# Patient Record
Sex: Male | Born: 2000 | Hispanic: No | Marital: Single | Smoking: Never smoker
Health system: Southern US, Community
[De-identification: ages and names within clinical notes are randomized; demographics above are authoritative.]

## PROBLEM LIST (undated history)

## (undated) DIAGNOSIS — G510 Bell's palsy: Secondary | ICD-10-CM

## (undated) HISTORY — PX: WISDOM TOOTH EXTRACTION: SHX21

## (undated) HISTORY — DX: Bell's palsy: G51.0

---
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
## 2022-05-30 ENCOUNTER — Emergency Department (HOSPITAL_BASED_OUTPATIENT_CLINIC_OR_DEPARTMENT_OTHER): Payer: Medicaid Other | Admitting: Radiology

## 2022-05-30 ENCOUNTER — Emergency Department (HOSPITAL_BASED_OUTPATIENT_CLINIC_OR_DEPARTMENT_OTHER): Payer: Medicaid Other

## 2022-05-30 ENCOUNTER — Encounter (HOSPITAL_BASED_OUTPATIENT_CLINIC_OR_DEPARTMENT_OTHER): Payer: Self-pay | Admitting: Emergency Medicine

## 2022-05-30 ENCOUNTER — Emergency Department (HOSPITAL_BASED_OUTPATIENT_CLINIC_OR_DEPARTMENT_OTHER)
Admission: EM | Admit: 2022-05-30 | Discharge: 2022-05-30 | Disposition: A | Discharge status: Home / Self Care | Payer: Medicaid Other | Attending: Emergency Medicine | Admitting: Emergency Medicine

## 2022-05-30 DIAGNOSIS — R112 Nausea with vomiting, unspecified: Secondary | ICD-10-CM | POA: Diagnosis not present

## 2022-05-30 DIAGNOSIS — R200 Anesthesia of skin: Secondary | ICD-10-CM | POA: Diagnosis present

## 2022-05-30 DIAGNOSIS — G510 Bell's palsy: Secondary | ICD-10-CM | POA: Insufficient documentation

## 2022-05-30 LAB — URINALYSIS, ROUTINE W REFLEX MICROSCOPIC
Bilirubin Urine: NEGATIVE
Cellular Cast, UA: 4
Glucose, UA: NEGATIVE mg/dL
Ketones, ur: 15 mg/dL — AB
Leukocytes,Ua: NEGATIVE
Nitrite: NEGATIVE
Specific Gravity, Urine: 1.041 — ABNORMAL HIGH (ref 1.005–1.030)
Trans Epithel, UA: 2
pH: 7 (ref 5.0–8.0)

## 2022-05-30 LAB — COMPREHENSIVE METABOLIC PANEL
ALT: 26 U/L (ref 0–44)
AST: 20 U/L (ref 15–41)
Albumin: 5.5 g/dL — ABNORMAL HIGH (ref 3.5–5.0)
Alkaline Phosphatase: 47 U/L (ref 38–126)
Anion gap: 19 — ABNORMAL HIGH (ref 5–15)
BUN: 25 mg/dL — ABNORMAL HIGH (ref 6–20)
CO2: 23 mmol/L (ref 22–32)
Calcium: 11.1 mg/dL — ABNORMAL HIGH (ref 8.9–10.3)
Chloride: 98 mmol/L (ref 98–111)
Creatinine, Ser: 1.45 mg/dL — ABNORMAL HIGH (ref 0.61–1.24)
GFR, Estimated: >60 mL/min (ref >60)
Glucose, Bld: 108 mg/dL — ABNORMAL HIGH (ref 70–99)
Potassium: 3.8 mmol/L (ref 3.5–5.1)
Sodium: 140 mmol/L (ref 135–145)
Total Bilirubin: 2 mg/dL — ABNORMAL HIGH (ref 0.3–1.2)
Total Protein: 8.7 g/dL — ABNORMAL HIGH (ref 6.5–8.1)

## 2022-05-30 LAB — CBC WITH DIFFERENTIAL/PLATELET
Abs Immature Granulocytes: 0.07 10*3/uL (ref 0.00–0.07)
Basophils Absolute: 0 10*3/uL (ref 0.0–0.1)
Basophils Relative: 0 %
Eosinophils Absolute: 0 10*3/uL (ref 0.0–0.5)
Eosinophils Relative: 0 %
HCT: 47.1 % (ref 39.0–52.0)
Hemoglobin: 16 g/dL (ref 13.0–17.0)
Immature Granulocytes: 1 %
Lymphocytes Relative: 13 %
Lymphs Abs: 1.9 10*3/uL (ref 0.7–4.0)
MCH: 28.7 pg (ref 26.0–34.0)
MCHC: 34 g/dL (ref 30.0–36.0)
MCV: 84.4 fL (ref 80.0–100.0)
Monocytes Absolute: 1.6 10*3/uL — ABNORMAL HIGH (ref 0.1–1.0)
Monocytes Relative: 11 %
Neutro Abs: 11 10*3/uL — ABNORMAL HIGH (ref 1.7–7.7)
Neutrophils Relative %: 75 %
Platelets: 215 10*3/uL (ref 150–400)
RBC: 5.58 MIL/uL (ref 4.22–5.81)
RDW: 12.3 % (ref 11.5–15.5)
WBC: 14.6 10*3/uL — ABNORMAL HIGH (ref 4.0–10.5)
nRBC: 0 % (ref 0.0–0.2)

## 2022-05-30 LAB — PROTIME-INR
INR: 1.1 (ref 0.8–1.2)
Prothrombin Time: 14.2 seconds (ref 11.4–15.2)

## 2022-05-30 LAB — LACTIC ACID, PLASMA
Lactic Acid, Venous: 3.5 mmol/L (ref 0.5–1.9)
Lactic Acid, Venous: 1.7 mmol/L (ref 0.5–1.9)

## 2022-05-30 MED ORDER — ONDANSETRON HCL 4 MG/2ML IJ SOLN
4.0000 mg | Freq: Once | INTRAMUSCULAR | Status: AC
  Administered 2022-05-30: 4 mg via INTRAVENOUS
  Filled 2022-05-30: qty 2

## 2022-05-30 MED ORDER — SODIUM CHLORIDE 0.9 % IV BOLUS
1000.0000 mL | Freq: Once | INTRAVENOUS | Status: AC
  Administered 2022-05-30: 1000 mL via INTRAVENOUS

## 2022-05-30 MED ORDER — IOHEXOL 300 MG/ML  SOLN
75.0000 mL | Freq: Once | INTRAMUSCULAR | Status: AC | PRN
  Administered 2022-05-30: 75 mL via INTRAVENOUS

## 2022-05-30 MED ORDER — OXYCODONE-ACETAMINOPHEN 5-325 MG PO TABS: 1.0000 | ORAL_TABLET | Freq: Three times a day (TID) | ORAL | 0 refills | Status: AC | PRN

## 2022-05-30 MED ORDER — MORPHINE SULFATE (PF) 4 MG/ML IV SOLN
4.0000 mg | Freq: Once | INTRAVENOUS | Status: AC
  Administered 2022-05-30: 4 mg via INTRAVENOUS
  Filled 2022-05-30: qty 1

## 2022-05-30 MED ORDER — PREDNISONE 10 MG PO TABS: 60.0000 mg | ORAL_TABLET | Freq: Every day | ORAL | 0 refills | Status: AC

## 2022-05-30 MED ORDER — POLYVINYL ALCOHOL 1.4 % OP SOLN: 1.0000 [drp] | OPHTHALMIC | 1 refills | Status: AC

## 2022-05-30 MED ORDER — ONDANSETRON 4 MG PO TBDP: 4.0000 mg | ORAL_TABLET | Freq: Three times a day (TID) | ORAL | 0 refills | Status: DC | PRN

## 2022-05-30 NOTE — ED Triage Notes (Signed)
Pt had all wisdom teeth extracted yesterday. Left bottom was infected prior, was treated with amoxicillin, started Monday, extraction Wednesday, still on antibiotic. Left side of face "not moving" ,was like this last week.

## 2022-05-30 NOTE — ED Notes (Signed)
Patient transported to CT 

## 2022-05-30 NOTE — ED Provider Notes (Signed)
[facility] EMERGENCY DEPT Provider Note   CSN: [id number] Arrival date & time: 05/30/22  1823     History  Chief Complaint  Patient presents with   Emesis    Caleb Mendoza is a 21 y.o. male.  Patient with no pertinent past medical history presents today with complaints of left sided facial numbness, nausea, and vomiting. He states that approximately 2 weeks ago he began to develop pain in his left lower molars. He states that around 10 days ago he noticed some numbness in his left lower cheek area. He states that over the past several days this progressively worsened and now the entire left side of his face is numb. He states that given his persistent left lower tooth pain, he presented to his dentist for further evaluation and management of same. He states that his dentist suspected that his symptoms were related to an infected left lower wisdom tooth. He states that he was then placed on Amoxicillin for management of his infection which he started on Monday. He states he has been taking this as prescribed without complication. He then had all 4 of his wisdom teeth removed yesterday without complication. He has continued to take the Amoxicillin without any missed doses. States that today he began to have significant nausea and vomiting throughout the day which he attributed to swallowing blood from his dental procedure. He feels that he is severely dehydrated due to this. Denies any diarrhea, states that he has been having normal bowel movements and passing flatus regularly. He states that he continues to have facial numbness and paralysis of the entire left side of his face up to his forehead as well. He is unable to close his left eyelid. He denies fevers, chills, chest pain, shortness of breath, or abdominal pain. Denies any dysuria or hematuria. States that he is supposed to follow-up with his oral surgeon in the next few weeks.  The history is provided by the patient. No  language interpreter was used.  Emesis Associated symptoms: no headaches        Home Medications Prior to Admission medications   Medication Sig Start Date End Date Taking? Authorizing Provider  cephALEXin (KEFLEX) 500 MG capsule Take 1 capsule (500 mg total) by mouth 4 (four) times daily. 09/08/12   Molpus, John, MD  HYDROcodone-acetaminophen (NORCO) 5-325 MG tablet Take 1-2 tablets by mouth every 4 (four) hours as needed for severe pain. 09/29/15   Mack Hook, MD      Allergies    Patient has no known allergies.    Review of Systems   Review of Systems  Gastrointestinal:  Positive for nausea and vomiting.  Neurological:  Positive for facial asymmetry and numbness. Negative for dizziness, tremors, seizures, syncope, speech difficulty, weakness, light-headedness and headaches.  All other systems reviewed and are negative.   Physical Exam Updated Vital Signs BP 124/67   Pulse 71   Temp 98 F (36.7 C) (Oral)   Resp 13   SpO2 100%  Physical Exam Vitals and nursing note reviewed.  Constitutional:      General: He is not in acute distress.    Appearance: Normal appearance. He is normal weight. He is not ill-appearing, toxic-appearing or diaphoretic.  HENT:     Head: Normocephalic and atraumatic.     Comments: No obvious swelling or abscess present.    Right Ear: Tympanic membrane, ear canal and external ear normal.     Left Ear: Tympanic membrane, ear canal and external ear  normal.     Ears:     Comments: Inner ears without signs of rashes, lesions, or other abnormality bilaterally    Mouth/Throat:     Comments: Postsurgical open wounds in the back of bilateral upper and lower back molars normal appearing without obvious drainage or bleeding. Blood clots visualized in each location without signs of dry socket. No intraoral swelling or signs of Ludwig's angina Eyes:     Extraocular Movements: Extraocular movements intact.     Pupils: Pupils are equal, round, and reactive  to light.  Cardiovascular:     Rate and Rhythm: Normal rate and regular rhythm.     Heart sounds: Normal heart sounds.  Pulmonary:     Effort: Pulmonary effort is normal. No respiratory distress.     Breath sounds: Normal breath sounds.  Abdominal:     General: Abdomen is flat.     Palpations: Abdomen is soft.  Musculoskeletal:        General: Normal range of motion.     Cervical back: Normal range of motion and neck supple.  Skin:    General: Skin is warm and dry.  Neurological:     General: No focal deficit present.     Mental Status: He is alert and oriented to person, place, and time.     GCS: GCS eye subscore is 4. GCS verbal subscore is 5. GCS motor subscore is 6.     Sensory: Sensation is intact.     Motor: Motor function is intact.     Coordination: Coordination is intact.     Gait: Gait is intact.     Comments: Alert and oriented to self, place, time and event.    Speech is fluent, clear without dysarthria or dysphasia.    Strength 5/5 in upper/lower extremities   Sensation intact in upper/lower extremities   Patient endorses complete left sided facial numbness from his forehead to his mandible. He is unable to raise the corner of the left side of his mouth. Flattening of the nasolabial fold on the left side. Unable to raise the left eyebrow. Loss of wrinkles on the left side of the face.   PERRLA and EOMs intact bilaterally. Intact visual fields. No uvula deviation, symmetric rise of soft palate. 5/5 SCM and trapezius strength bilaterally. Midline tongue protrusion, symmetric L/R movements   Psychiatric:        Mood and Affect: Mood normal.        Behavior: Behavior normal.     ED Results / Procedures / Treatments   Labs (all labs ordered are listed, but only abnormal results are displayed) Labs Reviewed  COMPREHENSIVE METABOLIC PANEL - Abnormal; Notable for the following components:      Result Value   Glucose, Bld 108 (*)    BUN 25 (*)    Creatinine, Ser  1.45 (*)    Calcium 11.1 (*)    Total Protein 8.7 (*)    Albumin 5.5 (*)    Total Bilirubin 2.0 (*)    Anion gap 19 (*)    All other components within normal limits  LACTIC ACID, PLASMA - Abnormal; Notable for the following components:   Lactic Acid, Venous 3.5 (*)    All other components within normal limits  CBC WITH DIFFERENTIAL/PLATELET - Abnormal; Notable for the following components:   WBC 14.6 (*)    Neutro Abs 11.0 (*)    Monocytes Absolute 1.6 (*)    All other components within normal limits  URINALYSIS, ROUTINE  W REFLEX MICROSCOPIC - Abnormal; Notable for the following components:   Specific Gravity, Urine 1.041 (*)    Hgb urine dipstick TRACE (*)    Ketones, ur 15 (*)    Protein, ur TRACE (*)    Bacteria, UA RARE (*)    All other components within normal limits  CULTURE, BLOOD (ROUTINE X 2)  LACTIC ACID, PLASMA  PROTIME-INR    EKG None  Radiology CT Maxillofacial W Contrast  Result Date: 05/30/2022 CLINICAL DATA:  Facial swelling and pain. Recent wisdom tooth extraction. EXAM: CT MAXILLOFACIAL WITH CONTRAST TECHNIQUE: Multidetector CT imaging of the maxillofacial structures was performed with intravenous contrast. Multiplanar CT image reconstructions were also generated. RADIATION DOSE REDUCTION: This exam was performed according to the departmental dose-optimization program which includes automated exposure control, adjustment of the mA and/or kV according to patient size and/or use of iterative reconstruction technique. CONTRAST:  75mL OMNIPAQUE IOHEXOL 300 MG/ML  SOLN COMPARISON:  None Available. FINDINGS: Osseous: Postsurgical changes of recent third molar extractions. Orbits: Normal Sinuses: Small amount of fluid in the right maxillary sinus and small retention cyst in the left. Soft tissues: No abscess or drainable fluid collection. No discrete abnormality. Limited intracranial: Normal. IMPRESSION: Postsurgical changes of recent third molar extractions. No abscess or  drainable fluid collection. Electronically Signed   By: Deatra Robinson M.D.   On: 05/30/2022 20:54   DG Chest 2 View  Result Date: 05/30/2022 CLINICAL DATA:  Suspected sepsis. EXAM: CHEST - 2 VIEW COMPARISON:  None Available. FINDINGS: The heart size and mediastinal contours are within normal limits. Mild hyperinflation of the lungs is noted. Both lungs are clear. No acute osseous abnormality. IMPRESSION: No active cardiopulmonary disease. Electronically Signed   By: Thornell Sartorius M.D.   On: 05/30/2022 20:41    Procedures Procedures    Medications Ordered in ED Medications  ondansetron (ZOFRAN) injection 4 mg (4 mg Intravenous Given 05/30/22 1919)  sodium chloride 0.9 % bolus 1,000 mL (0 mLs Intravenous Stopped 05/30/22 2136)  iohexol (OMNIPAQUE) 300 MG/ML solution 75 mL (75 mLs Intravenous Contrast Given 05/30/22 2032)  sodium chloride 0.9 % bolus 1,000 mL (1,000 mLs Intravenous New Bag/Given 05/30/22 2134)  morphine (PF) 4 MG/ML injection 4 mg (4 mg Intravenous Given 05/30/22 2233)  ondansetron (ZOFRAN) injection 4 mg (4 mg Intravenous Given 05/30/22 2233)    ED Course/ Medical Decision Making/ A&P                           Medical Decision Making Amount and/or Complexity of Data Reviewed Labs: ordered. Radiology: ordered.  Risk Prescription drug management.   This patient presents to the ED for concern of left sided facial numbness, nausea, and vomiting, this involves an extensive number of treatment options, and is a complaint that carries with it a high risk of complications and morbidity.   Co morbidities that complicate the patient evaluation  none   Lab Tests:  I Ordered, and personally interpreted labs.  The pertinent results include:  BUN 25, creatinine 1.45, calcium 11.1, total protein 8.7, albumin 5.5, t. Bili 2.0, anion gap 19. Lactic acid 3.5 --> 1.7. WBC 14.6. UA with ketonuria and proteinuria   Imaging Studies ordered:  I ordered imaging studies including CXR, CT  max/face  I independently visualized and interpreted imaging which showed  CXR: no active cardiopulmonary disease CT: Postsurgical changes of recent third molar extractions. No abscess or drainable fluid collection. I agree with the radiologist  interpretation   Problem List / ED Course / Critical interventions / Medication management  I ordered medication including zofran for nausea and vomiting, fluids for dehydration and percocet  for pain from tooth extraction  Reevaluation of the patient after these medicines showed that the patient improved I have reviewed the patients home medicines and have made adjustments as needed   Test / Admission - Considered:  Patient presents today with complaints of left sided facial numbness, nausea, and vomiting. He is afebrile, non-toxic appearing, and in no acute distress with reassuring vital signs. Laboratory abnormalities consistent with dehydration and reactive post-surgical changes. CT without evidence of abscess. Patient is alert and oriented with neurological changes consistent with Bell's palsy likely due to inflammatory facial changes from his tooth infection. No concern for CVA given patient is young and otherwise healthy with no deficits inconsistent with Bell's palsy. Patient has now been symptomatic for 10 days, however will treat with high dose prednisone and close neurology follow-up. Will also give liquid tears as he is having difficulty closing his left eye.  Additionally, patient with nausea and vomiting x 1 day. Patient was very dehydration on laboratory evaluation and was repleted with fluid boluses x2. After anti-emetics and fluids, patient states he is feeling much better. He is no longer nauseous or vomiting and is able to tolerate po intake without difficulty. His abdomen is soft and non-tender. He is having regular bowel movements. No concern for bowel obstruction or other acute intra-abdominal abnormalities at this time. Patient is  stable for discharge at this time. Will give prescription for Zofran for nausea. Will also give percocet for post-surgical pain as well. I have reviewed PDMP and deemed patient an adequate candidate for a short course of narcotic pain medication. Patient is understanding and amenable with plan, educated on red flag symptoms that would prompt immediate return. Discharged in stable condition.    This is a shared visit with supervising physician Dr. Deretha EmoryZackowski who has independently evaluated patient & provided guidance in evaluation/management/disposition, in agreement with care    Final Clinical Impression(s) / ED Diagnoses Final diagnoses:  Bell's palsy  Nausea and vomiting, unspecified vomiting type    Rx / DC Orders ED Discharge Orders          Ordered    predniSONE (DELTASONE) 10 MG tablet  Daily        05/30/22 2323    ondansetron (ZOFRAN-ODT) 4 MG disintegrating tablet  Every 8 hours PRN        05/30/22 2323    oxyCODONE-acetaminophen (PERCOCET/ROXICET) 5-325 MG tablet  Every 8 hours PRN        05/30/22 2323    polyvinyl alcohol (LIQUIFILM TEARS) 1.4 % ophthalmic solution  Every hour while awake        05/30/22 2323          An After Visit Summary was printed and given to the patient.     Vear ClockSmoot, Aarin Sparkman A, PA-C 06/03/22 1601    Vanetta MuldersZackowski, Scott, MD 07/01/22 [phone]

## 2022-05-30 NOTE — Discharge Instructions (Addendum)
As we discussed, the numbness in your face is consistent with a condition called Bell's palsy.  I suspect that this condition occurred due to your tooth infection.  You will need to take prednisone as prescribed for management of the symptoms.  I have also given you a referral to neurology for further evaluation and management of your condition.  In the interim, while you are symptomatic you will need to use the Liquifilm Tears in your left eye to help lubricate your eye since you are unable to close it all the way.  Additionally, I have given you a prescription for narcotic pain medication to help with your dental pain from your procedure.  Please take this as prescribed for severe pain only and do not drive or operate heavy machinery after taking this medication.   Also, for your nausea and vomiting I have given you a prescription for Zofran which is a an antinausea medication for you to take as prescribed as needed for nausea and vomiting.  Strongly recommend that you follow-up with your primary care doctor in the next few days for continued evaluation and management of your symptoms.  Return if development of any new or worsening symptoms.

## 2022-05-31 ENCOUNTER — Encounter: Payer: Self-pay | Admitting: Neurology

## 2022-06-04 LAB — CULTURE, BLOOD (ROUTINE X 2)
Culture: NO GROWTH
Special Requests: ADEQUATE

## 2022-08-20 NOTE — Progress Notes (Unsigned)
   NEUROLOGY CONSULTATION NOTE  Caleb Mendoza MRN: [id number] DOB: Dec 22, 2001  Referring provider: Vanetta Mulders, MD (ED referral) Primary care provider: Irven Coe, MD  Reason for consult:  Bell's palsy  Assessment/Plan:   Left sided Bell's palsy  Symptoms resolved but with some residual abarrent regeneration.  No further workup or management indicated.  Follow up as needed.   Subjective:  Caleb Mendoza is a 21 year old right-handed male who presents for left-sided Bell's palsy.  History supplemented by ED note.  CT maxillofacial personally reviewed.  In late May, he developed pain in the left lower molar.  A couple of days later, he noticed that the left lower cheek felt numb.  Over the next day or so, the numbness spread to the entire left side of his face and he had left sided upper and lower facial weakness.  He saw a dentist for noted infection of the left lower wisdom tooth and was started on amoxicillin followed by extraction of all 4 wisdom teeth on 6/7.  As symptoms persisted, he went to the ED where CT maxillofacial with and without contrast revealed no abscess or fluid collection.  He was diagnosed with Bell's palsy and discharged on prednisone taper.  Symptoms gradually resolved over one month.  He now notes that when he eats, his left eye tears.       PAST MEDICAL HISTORY: No past medical history on file.  PAST SURGICAL HISTORY: No past surgical history on file.  MEDICATIONS: Current Outpatient Medications on File Prior to Visit  Medication Sig Dispense Refill   cephALEXin (KEFLEX) 500 MG capsule Take 1 capsule (500 mg total) by mouth 4 (four) times daily. 28 capsule 0   ondansetron (ZOFRAN-ODT) 4 MG disintegrating tablet Take 1 tablet (4 mg total) by mouth every 8 (eight) hours as needed for nausea or vomiting. 20 tablet 0   No current facility-administered medications on file prior to visit.    ALLERGIES: No Known Allergies  FAMILY HISTORY: No family  history on file.  Objective:  Blood pressure 125/81, pulse 73, height 5\' 11"  (1.803 m), weight 204 lb 3.2 oz (92.6 kg), SpO2 99 %. General: No acute distress.  Patient appears well-groomed.   Head:  Normocephalic/atraumatic Eyes:  fundi examined but not visualized Neck: supple, no paraspinal tenderness, full range of motion Back: No paraspinal tenderness Heart: regular rate and rhythm Lungs: Clear to auscultation bilaterally. Vascular: No carotid bruits. Neurological Exam: Mental status: alert and oriented to person, place, and time, speech fluent and not dysarthric, language intact. Cranial nerves: CN I: not tested CN II: pupils equal, round and reactive to light, visual fields intact CN III, IV, VI:  full range of motion, no nystagmus, no ptosis CN V: facial sensation intact. CN VII: upper and lower face symmetric CN VIII: hearing intact CN IX, X: gag intact, uvula midline CN XI: sternocleidomastoid and trapezius muscles intact CN XII: tongue midline Bulk & Tone: normal, no fasciculations. Motor:  muscle strength 5/5 throughout Sensation:  Pinprick, temperature and vibratory sensation intact. Deep Tendon Reflexes:  2+ throughout,  toes downgoing.   Finger to nose testing:  Without dysmetria.   Heel to shin:  Without dysmetria.   Gait:  Normal station and stride.  Romberg negative.    Thank you for allowing me to take part in the care of this patient.  , DO  CC: Shon Millet, MD

## 2022-08-21 ENCOUNTER — Encounter: Payer: Self-pay | Admitting: Neurology

## 2022-08-21 ENCOUNTER — Ambulatory Visit: Payer: Medicaid Other | Admitting: Neurology

## 2022-08-21 VITALS — BP 125/81 | HR 73 | Ht 71.0 in | Wt 204.2 lb

## 2022-08-21 DIAGNOSIS — G510 Bell's palsy: Secondary | ICD-10-CM | POA: Diagnosis not present

## 2022-08-21 NOTE — Patient Instructions (Signed)
Bell's Palsy, Adult  Bell's palsy is a short-term inability to move muscles in a part of the face. The inability to move, also called paralysis, results from inflammation or compression of the seventh cranial nerve. This nerve travels along the skull and under the ear to the side of the face. This nerve is responsible for facial movements that include blinking, closing the eyes, smiling, and frowning. What are the causes? The exact cause of this condition is not known. It may be caused by an infection from a virus, such as the chickenpox (herpes zoster), Epstein-Barr, or mumps virus. What increases the risk? You are more likely to develop this condition if: You are pregnant. You have diabetes. You have had a recent infection in your nose, throat, or airways. You have a weakened body defense system (immune system). You have had a facial injury, such as a fracture. You have a family history of Bell's palsy. What are the signs or symptoms? Symptoms of this condition include: Weakness on one side of the face. Drooping eyelid and corner of the mouth. Excessive tearing in one eye. Difficulty closing the eyelid. Dry eye. Drooling. Dry mouth. Changes in taste. Change in facial appearance. Pain behind one ear. Ringing in one or both ears. Sensitivity to sound in one ear. Facial twitching. Headache. Impaired speech. Dizziness. Difficulty eating or drinking. Most of the time, only one side of the face is affected. In rare cases, Bell's palsy may affect the whole face. How is this diagnosed? This condition is diagnosed based on: Your symptoms. Your medical history. A physical exam. You may also have to see health care providers who specialize in disorders of the nerves (neurologist) or diseases and conditions of the eye (ophthalmologist). You may have tests, such as: A test to check for nerve damage (electromyogram). Imaging studies, such as a CT scan or an MRI. Blood tests. How is this  treated? This condition affects every person differently. Sometimes symptoms go away without treatment within a couple weeks. If treatment is needed, it varies from person to person. The goal of treatment is to reduce inflammation and protect the eye from damage. Treatment for Bell's palsy may include: Medicines, such as: Steroids to reduce swelling and inflammation. Antiviral medicines. Pain relievers, including aspirin, acetaminophen, or ibuprofen. Eye drops or ointment to keep your eye moist. Eye protection, if you cannot close your eye. Exercises or massage to regain muscle strength and function (physical therapy). Follow these instructions at home:  Take over-the-counter and prescription medicines only as told by your health care provider. If your eye is affected: Keep your eye moist with eye drops or ointment as told by your health care provider. Follow instructions for eye care and protection as told by your health care provider. Do any physical therapy exercises as told by your health care provider. Keep all follow-up visits. This is important. Contact a health care provider if: You have a fever or chills. Your symptoms do not get better within 2-3 weeks, or your symptoms get worse. Your eye is red, irritated, or painful. You have new symptoms. Get help right away if: You have weakness or numbness in a part of your body other than your face. You have trouble swallowing. You develop neck pain or stiffness. You develop dizziness or shortness of breath. Summary Bell's palsy is a short-term inability to move muscles in a part of the face. The inability to move results from inflammation or compression of the facial nerve. This condition affects every person   differently. Sometimes symptoms go away without treatment within a couple weeks. If treatment is needed, it varies from person to person. The goal of treatment is to reduce inflammation and protect the eye from damage. Contact  your health care provider if your symptoms do not get better within 2-3 weeks, or your symptoms get worse. This information is not intended to replace advice given to you by your health care provider. Make sure you discuss any questions you have with your health care provider. Document Revised: 09/07/2020 Document Reviewed: 09/07/2020 Elsevier Patient Education  2023 [employer]  

## 2022-10-15 IMAGING — DX DG CHEST 2V
2 series · 2 of 2 positions shown · non-contrast
Comparison: None Available.

CLINICAL DATA: Suspected sepsis.

EXAM:
CHEST - 2 VIEW

[chest pa]
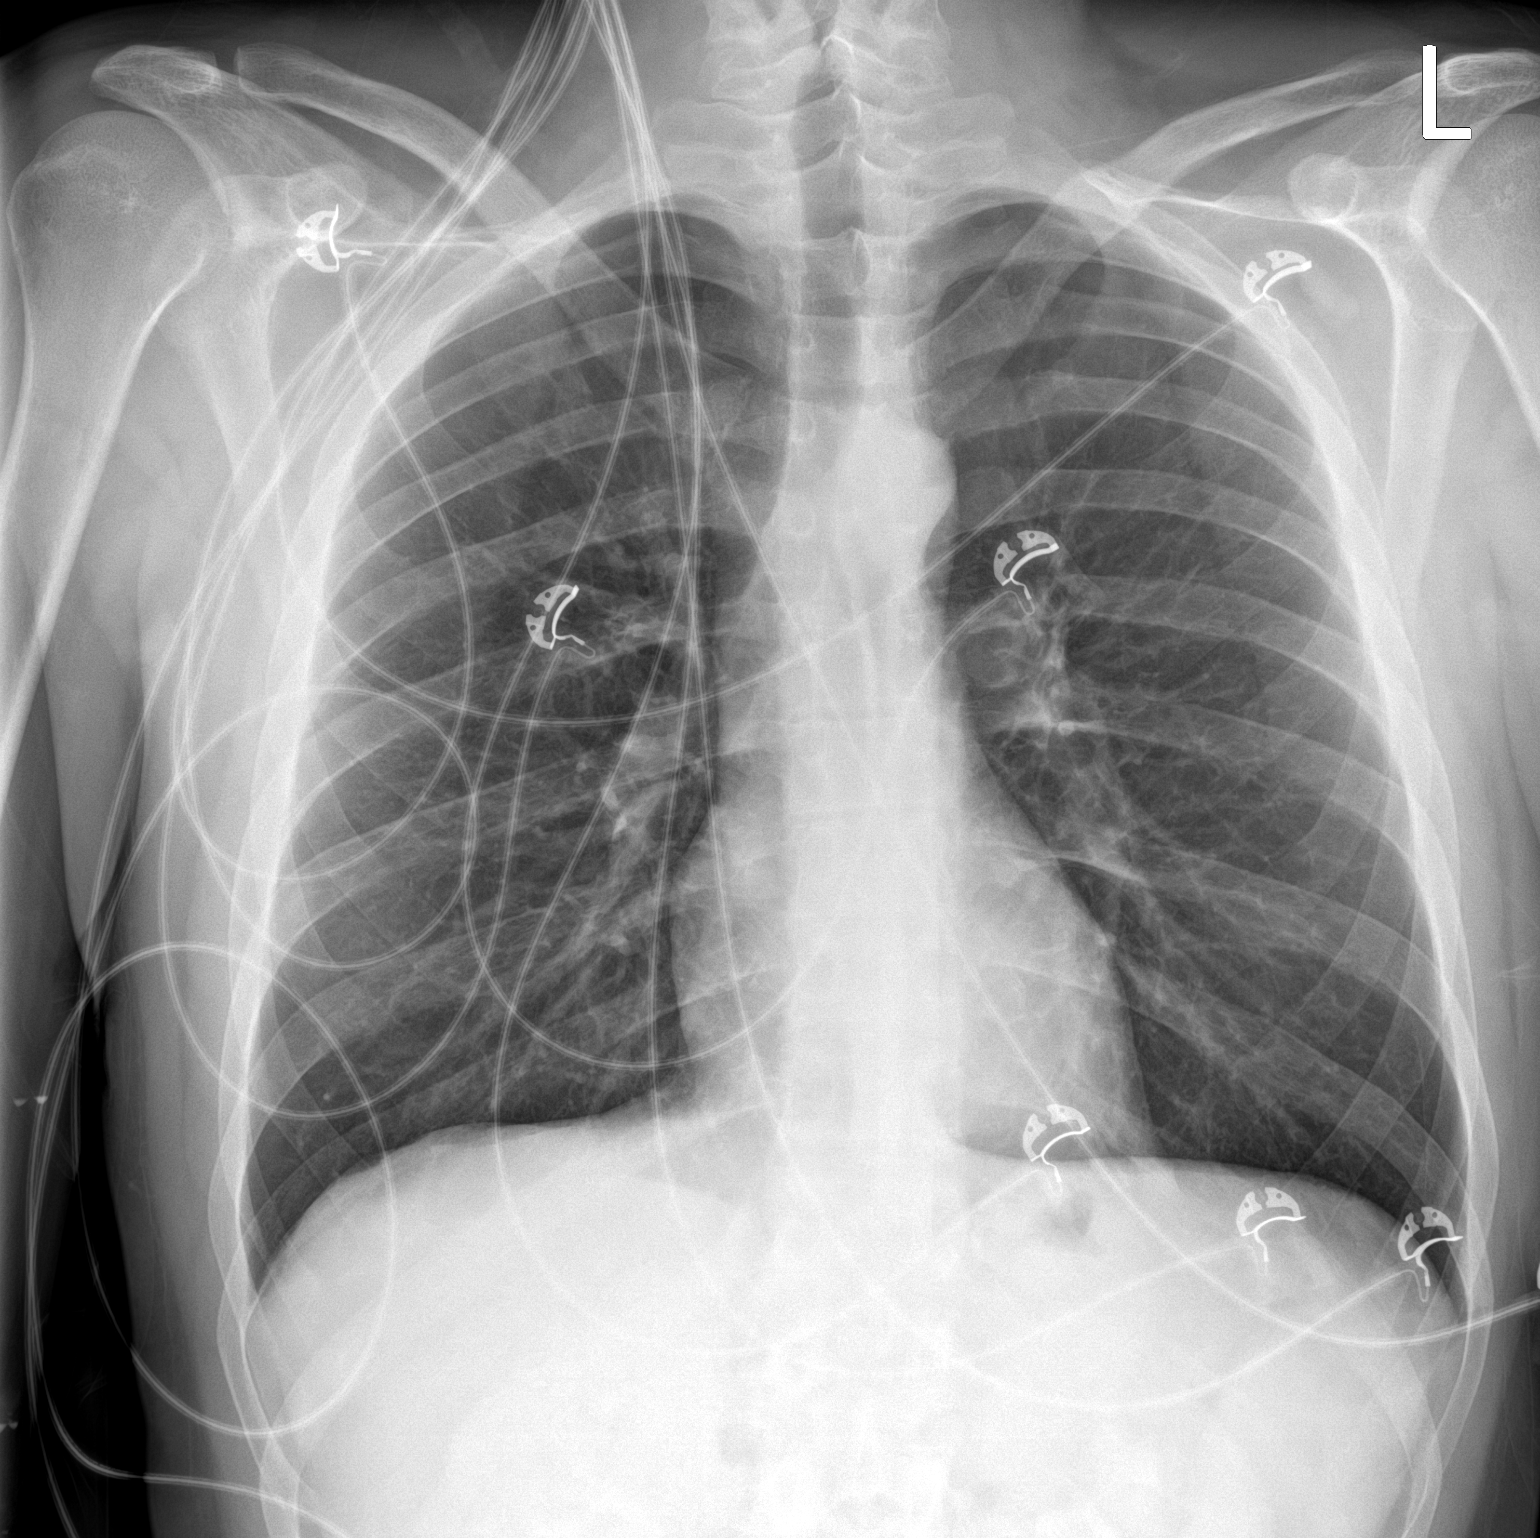

[chest lat]
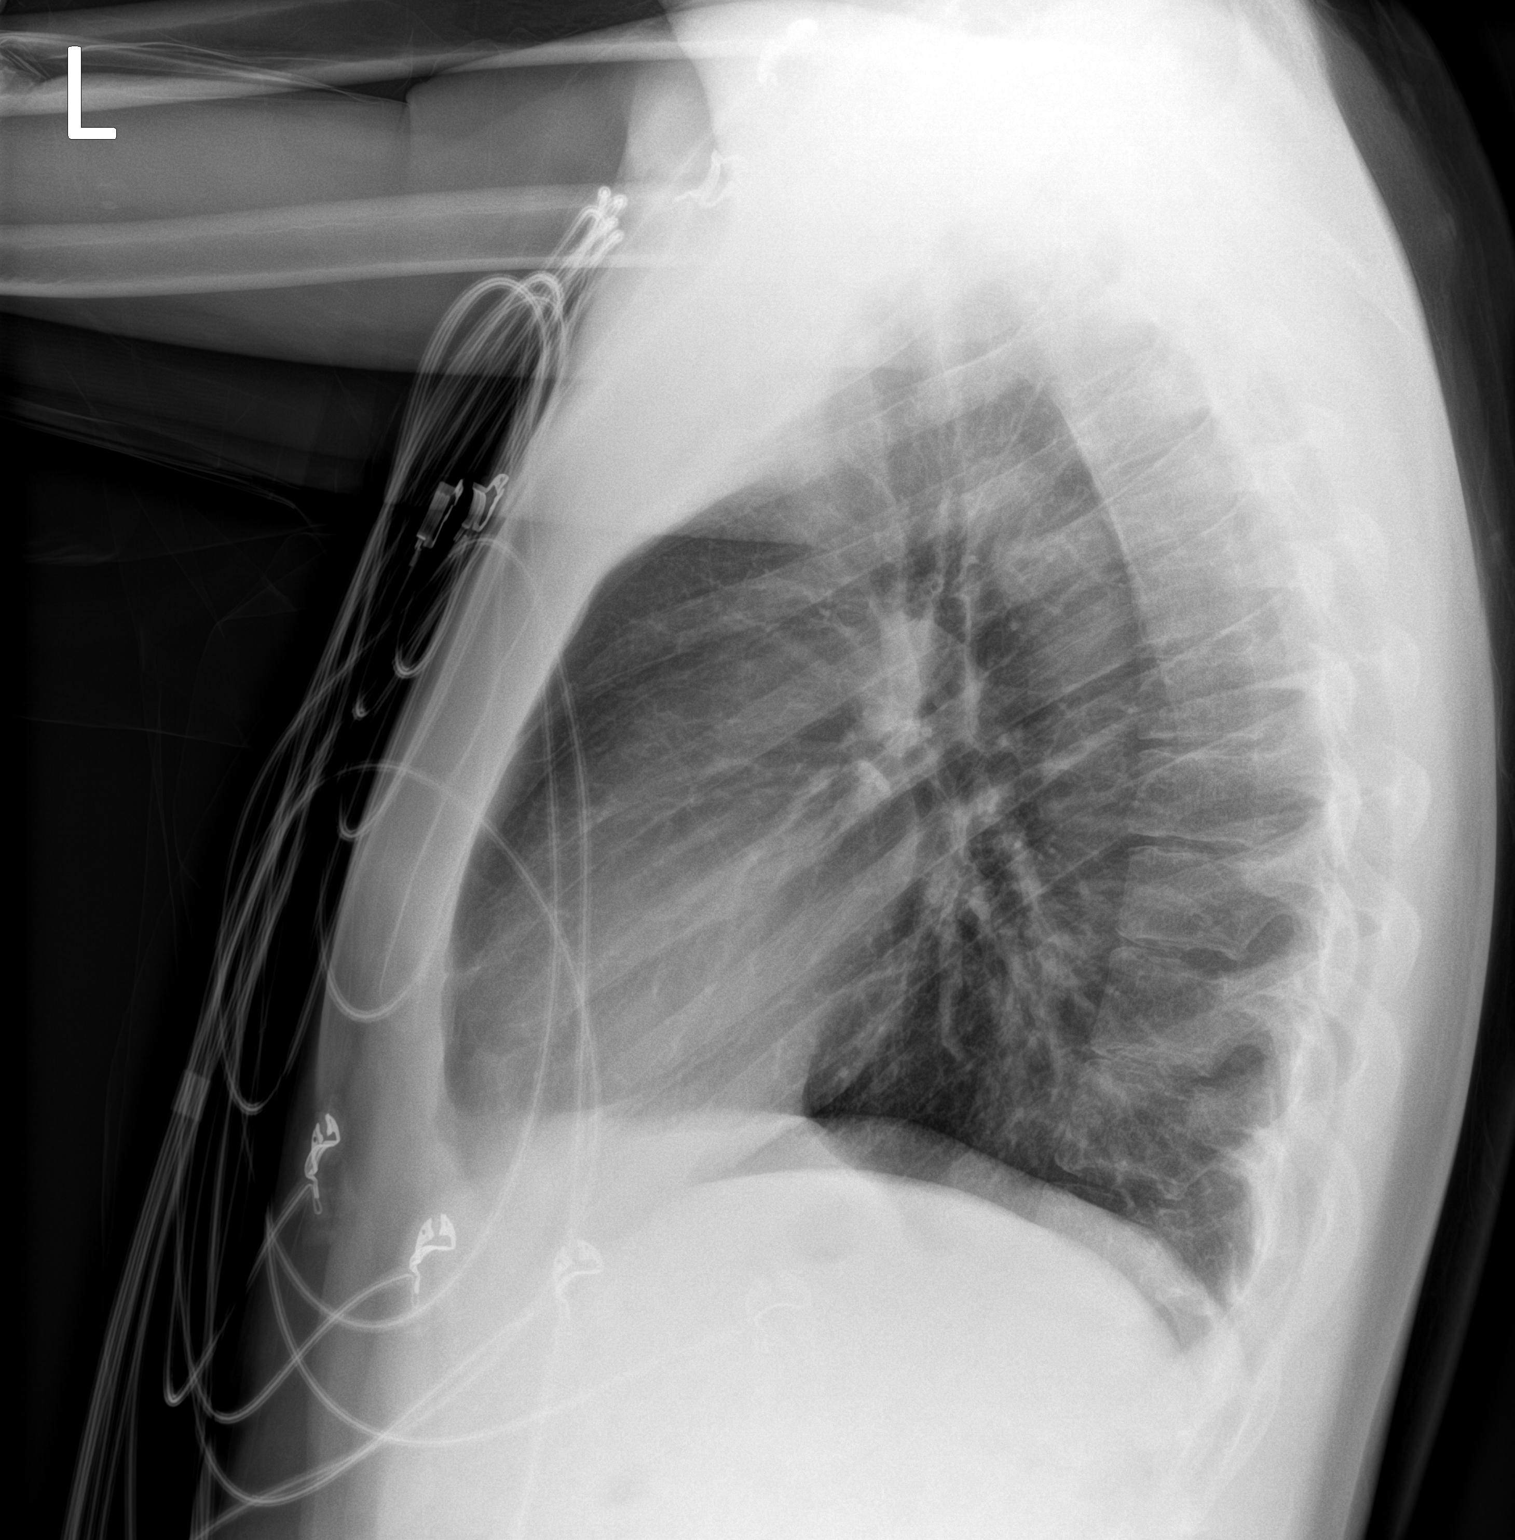

[2 of 2 positions shown; findings below may reference images not displayed]

FINDINGS: The heart size and mediastinal contours are within normal limits.
Mild hyperinflation of the lungs is noted. Both lungs are clear. No
acute osseous abnormality.
IMPRESSION: No active cardiopulmonary disease.

## 2023-07-14 ENCOUNTER — Emergency Department (HOSPITAL_BASED_OUTPATIENT_CLINIC_OR_DEPARTMENT_OTHER): Payer: Medicaid Other

## 2023-07-14 ENCOUNTER — Other Ambulatory Visit: Payer: Self-pay

## 2023-07-14 ENCOUNTER — Encounter (HOSPITAL_BASED_OUTPATIENT_CLINIC_OR_DEPARTMENT_OTHER): Payer: Self-pay | Admitting: *Deleted

## 2023-07-14 ENCOUNTER — Emergency Department (HOSPITAL_COMMUNITY): Payer: Medicaid Other

## 2023-07-14 ENCOUNTER — Inpatient Hospital Stay (HOSPITAL_BASED_OUTPATIENT_CLINIC_OR_DEPARTMENT_OTHER)
Admission: EM | Admit: 2023-07-14 | Discharge: 2023-07-19 | DRG: 154 | Disposition: A | Discharge status: Home / Self Care | Payer: Medicaid Other | Attending: Internal Medicine | Admitting: Internal Medicine

## 2023-07-14 DIAGNOSIS — H7090 Unspecified mastoiditis, unspecified ear: Secondary | ICD-10-CM | POA: Diagnosis present

## 2023-07-14 DIAGNOSIS — D72828 Other elevated white blood cell count: Secondary | ICD-10-CM | POA: Diagnosis present

## 2023-07-14 DIAGNOSIS — H65112 Acute and subacute allergic otitis media (mucoid) (sanguinous) (serous), left ear: Secondary | ICD-10-CM

## 2023-07-14 DIAGNOSIS — I82C12 Acute embolism and thrombosis of left internal jugular vein: Secondary | ICD-10-CM | POA: Diagnosis not present

## 2023-07-14 DIAGNOSIS — K5900 Constipation, unspecified: Secondary | ICD-10-CM | POA: Diagnosis not present

## 2023-07-14 DIAGNOSIS — H9192 Unspecified hearing loss, left ear: Secondary | ICD-10-CM | POA: Diagnosis present

## 2023-07-14 DIAGNOSIS — I808 Phlebitis and thrombophlebitis of other sites: Secondary | ICD-10-CM | POA: Diagnosis not present

## 2023-07-14 DIAGNOSIS — H7292 Unspecified perforation of tympanic membrane, left ear: Secondary | ICD-10-CM | POA: Diagnosis not present

## 2023-07-14 DIAGNOSIS — H70002 Acute mastoiditis without complications, left ear: Principal | ICD-10-CM

## 2023-07-14 DIAGNOSIS — G060 Intracranial abscess and granuloma: Secondary | ICD-10-CM | POA: Diagnosis not present

## 2023-07-14 DIAGNOSIS — I8290 Acute embolism and thrombosis of unspecified vein: Secondary | ICD-10-CM

## 2023-07-14 DIAGNOSIS — D696 Thrombocytopenia, unspecified: Secondary | ICD-10-CM | POA: Diagnosis present

## 2023-07-14 DIAGNOSIS — Z823 Family history of stroke: Secondary | ICD-10-CM

## 2023-07-14 DIAGNOSIS — H7092 Unspecified mastoiditis, left ear: Secondary | ICD-10-CM | POA: Diagnosis present

## 2023-07-14 DIAGNOSIS — H70892 Other mastoiditis and related conditions, left ear: Secondary | ICD-10-CM | POA: Diagnosis not present

## 2023-07-14 DIAGNOSIS — H6690 Otitis media, unspecified, unspecified ear: Secondary | ICD-10-CM | POA: Insufficient documentation

## 2023-07-14 DIAGNOSIS — Z885 Allergy status to narcotic agent status: Secondary | ICD-10-CM | POA: Diagnosis not present

## 2023-07-14 DIAGNOSIS — G08 Intracranial and intraspinal phlebitis and thrombophlebitis: Secondary | ICD-10-CM

## 2023-07-14 DIAGNOSIS — I809 Phlebitis and thrombophlebitis of unspecified site: Secondary | ICD-10-CM | POA: Insufficient documentation

## 2023-07-14 DIAGNOSIS — E876 Hypokalemia: Secondary | ICD-10-CM | POA: Diagnosis present

## 2023-07-14 DIAGNOSIS — H6692 Otitis media, unspecified, left ear: Secondary | ICD-10-CM | POA: Diagnosis present

## 2023-07-14 DIAGNOSIS — E871 Hypo-osmolality and hyponatremia: Secondary | ICD-10-CM | POA: Diagnosis not present

## 2023-07-14 LAB — COMPREHENSIVE METABOLIC PANEL
ALT: 21 U/L (ref 0–44)
AST: 18 U/L (ref 15–41)
Albumin: 4.8 g/dL (ref 3.5–5.0)
Alkaline Phosphatase: 54 U/L (ref 38–126)
Anion gap: 14 (ref 5–15)
BUN: 12 mg/dL (ref 6–20)
CO2: 24 mmol/L (ref 22–32)
Calcium: 9.9 mg/dL (ref 8.9–10.3)
Chloride: 99 mmol/L (ref 98–111)
Creatinine, Ser: 0.83 mg/dL (ref 0.61–1.24)
GFR, Estimated: >60 mL/min (ref >60)
Glucose, Bld: 115 mg/dL — ABNORMAL HIGH (ref 70–99)
Potassium: 3.5 mmol/L (ref 3.5–5.1)
Sodium: 137 mmol/L (ref 135–145)
Total Bilirubin: 1.5 mg/dL — ABNORMAL HIGH (ref 0.3–1.2)
Total Protein: 7.5 g/dL (ref 6.5–8.1)

## 2023-07-14 LAB — HIV ANTIBODY (ROUTINE TESTING W REFLEX): HIV Screen 4th Generation wRfx: NONREACTIVE

## 2023-07-14 LAB — CBC
HCT: 43.7 % (ref 39.0–52.0)
Hemoglobin: 15.3 g/dL (ref 13.0–17.0)
MCH: 29.1 pg (ref 26.0–34.0)
MCHC: 35 g/dL (ref 30.0–36.0)
MCV: 83.1 fL (ref 80.0–100.0)
Platelets: 172 10*3/uL (ref 150–400)
RBC: 5.26 MIL/uL (ref 4.22–5.81)
RDW: 12.5 % (ref 11.5–15.5)
WBC: 14.3 10*3/uL — ABNORMAL HIGH (ref 4.0–10.5)
nRBC: 0 % (ref 0.0–0.2)

## 2023-07-14 LAB — C-REACTIVE PROTEIN: CRP: <0.5 mg/dL (ref <1.0)

## 2023-07-14 LAB — HEPARIN LEVEL (UNFRACTIONATED): Heparin Unfractionated: 0.39 IU/mL (ref 0.30–0.70)

## 2023-07-14 LAB — SEDIMENTATION RATE: Sed Rate: 0 mm/hr (ref 0–16)

## 2023-07-14 MED ORDER — MORPHINE SULFATE (PF) 4 MG/ML IV SOLN
INTRAVENOUS | Status: AC
  Filled 2023-07-14: qty 1

## 2023-07-14 MED ORDER — HEPARIN BOLUS VIA INFUSION
3000.0000 [IU] | Freq: Once | INTRAVENOUS | Status: AC
  Administered 2023-07-14: 3000 [IU] via INTRAVENOUS
  Filled 2023-07-14: qty 3000

## 2023-07-14 MED ORDER — ONDANSETRON HCL 4 MG/2ML IJ SOLN
4.0000 mg | Freq: Once | INTRAMUSCULAR | Status: AC | PRN
  Administered 2023-07-14: 4 mg via INTRAVENOUS
  Filled 2023-07-14: qty 2

## 2023-07-14 MED ORDER — SODIUM CHLORIDE 0.9 % IV SOLN
3.0000 g | Freq: Once | INTRAVENOUS | Status: AC
  Administered 2023-07-14: 3 g via INTRAVENOUS

## 2023-07-14 MED ORDER — POLYETHYLENE GLYCOL 3350 17 G PO PACK
17.0000 g | PACK | Freq: Every day | ORAL | Status: DC | PRN
  Administered 2023-07-15: 17 g via ORAL
  Filled 2023-07-14: qty 1

## 2023-07-14 MED ORDER — ONDANSETRON HCL 4 MG PO TABS
4.0000 mg | ORAL_TABLET | Freq: Four times a day (QID) | ORAL | Status: DC | PRN
  Administered 2023-07-17 – 2023-07-19 (×2): 4 mg via ORAL
  Filled 2023-07-14 (×2): qty 1

## 2023-07-14 MED ORDER — CIPROFLOXACIN-DEXAMETHASONE 0.3-0.1 % OT SUSP
4.0000 [drp] | Freq: Two times a day (BID) | OTIC | Status: DC
  Administered 2023-07-14 – 2023-07-19 (×11): 4 [drp] via OTIC
  Filled 2023-07-14: qty 0.2

## 2023-07-14 MED ORDER — ONDANSETRON HCL 4 MG/2ML IJ SOLN
4.0000 mg | Freq: Four times a day (QID) | INTRAMUSCULAR | Status: DC | PRN
  Administered 2023-07-15 – 2023-07-18 (×5): 4 mg via INTRAVENOUS
  Filled 2023-07-14 (×6): qty 2

## 2023-07-14 MED ORDER — SODIUM CHLORIDE 0.9 % IV SOLN: Freq: Once | INTRAVENOUS | Status: AC

## 2023-07-14 MED ORDER — ACETAMINOPHEN 325 MG PO TABS: 650.0000 mg | ORAL_TABLET | Freq: Four times a day (QID) | ORAL | Status: DC | PRN

## 2023-07-14 MED ORDER — MORPHINE SULFATE (PF) 4 MG/ML IV SOLN
4.0000 mg | Freq: Once | INTRAVENOUS | Status: AC
  Administered 2023-07-14: 4 mg via INTRAVENOUS

## 2023-07-14 MED ORDER — ACETAMINOPHEN 650 MG RE SUPP: 650.0000 mg | Freq: Four times a day (QID) | RECTAL | Status: DC | PRN

## 2023-07-14 MED ORDER — KETOROLAC TROMETHAMINE 30 MG/ML IJ SOLN
30.0000 mg | Freq: Once | INTRAMUSCULAR | Status: AC
  Administered 2023-07-14: 30 mg via INTRAVENOUS
  Filled 2023-07-14: qty 1

## 2023-07-14 MED ORDER — METOCLOPRAMIDE HCL 5 MG/ML IJ SOLN
10.0000 mg | Freq: Once | INTRAMUSCULAR | Status: AC
  Administered 2023-07-14: 10 mg via INTRAVENOUS
  Filled 2023-07-14: qty 2

## 2023-07-14 MED ORDER — ACETAMINOPHEN 500 MG PO TABS
1000.0000 mg | ORAL_TABLET | Freq: Once | ORAL | Status: AC
  Administered 2023-07-14: 1000 mg via ORAL
  Filled 2023-07-14: qty 2

## 2023-07-14 MED ORDER — HYDROCODONE-ACETAMINOPHEN 5-325 MG PO TABS
1.0000 | ORAL_TABLET | Freq: Four times a day (QID) | ORAL | Status: DC | PRN
  Administered 2023-07-14 – 2023-07-15 (×2): 1 via ORAL
  Filled 2023-07-14 (×2): qty 1

## 2023-07-14 MED ORDER — ONDANSETRON HCL 4 MG/2ML IJ SOLN
4.0000 mg | Freq: Once | INTRAMUSCULAR | Status: AC
  Administered 2023-07-14: 4 mg via INTRAVENOUS
  Filled 2023-07-14: qty 2

## 2023-07-14 MED ORDER — IOHEXOL 350 MG/ML SOLN
75.0000 mL | Freq: Once | INTRAVENOUS | Status: AC | PRN
  Administered 2023-07-14: 75 mL via INTRAVENOUS

## 2023-07-14 MED ORDER — FENTANYL CITRATE PF 50 MCG/ML IJ SOSY
50.0000 ug | PREFILLED_SYRINGE | Freq: Once | INTRAMUSCULAR | Status: AC
  Administered 2023-07-14: 50 ug via INTRAVENOUS
  Filled 2023-07-14: qty 1

## 2023-07-14 MED ORDER — HEPARIN (PORCINE) 25000 UT/250ML-% IV SOLN
1650.0000 [IU]/h | INTRAVENOUS | Status: DC
  Administered 2023-07-14: 1400 [IU]/h via INTRAVENOUS
  Administered 2023-07-15: 1600 [IU]/h via INTRAVENOUS
  Administered 2023-07-15: 1500 [IU]/h via INTRAVENOUS
  Administered 2023-07-16: 1600 [IU]/h via INTRAVENOUS
  Administered 2023-07-17: 1650 [IU]/h via INTRAVENOUS
  Administered 2023-07-17: 1600 [IU]/h via INTRAVENOUS
  Administered 2023-07-18 – 2023-07-19 (×2): 1650 [IU]/h via INTRAVENOUS
  Filled 2023-07-14 (×8): qty 250

## 2023-07-14 MED ORDER — MORPHINE SULFATE (PF) 4 MG/ML IV SOLN
4.0000 mg | Freq: Once | INTRAVENOUS | Status: AC
  Administered 2023-07-14: 4 mg via INTRAVENOUS
  Filled 2023-07-14: qty 1

## 2023-07-14 MED ORDER — SODIUM CHLORIDE 0.9% FLUSH
3.0000 mL | Freq: Two times a day (BID) | INTRAVENOUS | Status: DC
  Administered 2023-07-14 – 2023-07-18 (×9): 3 mL via INTRAVENOUS

## 2023-07-14 MED ORDER — GADOBUTROL 1 MMOL/ML IV SOLN
10.0000 mL | Freq: Once | INTRAVENOUS | Status: AC | PRN
Start: 2023-07-14 — End: 2023-07-14
  Administered 2023-07-14: 10 mL via INTRAVENOUS

## 2023-07-14 MED ORDER — SODIUM CHLORIDE 0.9 % IV SOLN
3.0000 g | Freq: Four times a day (QID) | INTRAVENOUS | Status: DC
  Administered 2023-07-14 – 2023-07-19 (×20): 3 g via INTRAVENOUS
  Filled 2023-07-14 (×20): qty 8

## 2023-07-14 MED ORDER — HYDROMORPHONE HCL 1 MG/ML IJ SOLN
0.5000 mg | INTRAMUSCULAR | Status: DC | PRN
  Administered 2023-07-14 – 2023-07-18 (×25): 1 mg via INTRAVENOUS
  Filled 2023-07-14 (×26): qty 1

## 2023-07-14 NOTE — ED Triage Notes (Addendum)
Pt has a ruptured eardrum; has been having increased headache with NV, fever/chills since Saturday

## 2023-07-14 NOTE — Consult Note (Addendum)
ENT CONSULT:  Reason for Consult: Left purulent otorrhea, IJV thrombophlebitis in setting of acute otitis media and spontaneous tympanic membrane perforation  Referring Physician:  Gwyneth Sprout, MD  HPI: Caleb Mendoza is an 22 y.o. male who presents as a transfer from [facility] for evaluation of complication of left acute otitis media.  Patient states that his symptoms were started after a trip to the beach, at which time he was diagnosed with an ear infection.  He was prescribed a course of amoxicillin, and he states that a few days later his eardrum ruptured.  Since that time, he has had persistent purulent drainage from the left ear, which was treated with a course of topical drops, which patient cannot recall the name of.  Last night, patient began to have symptoms of a headache, as well as nausea and emesis and noticed bloody otorrhea, prompting him to seek evaluation at [facility] ED.  A CT temporal bone was obtained, which demonstrated complete opacification of the left mastoid, as well as a lucency near the ear and or table.  An MRI was subsequently performed which demonstrated internal jugular vein sinus thrombosis.  Patient has been admitted to hospital medicine, and has been treated with broad-spectrum IV antibiotics.  An ear wick was placed by the ED, and patient has been getting Ciprodex drops.  He denies symptoms of otalgia at present.  Continues to have purulent otorrhea and external ear edema.  He denies previous history of ear infection or ear surgery.   Past Medical History:  Diagnosis Date   Left-sided Bell's palsy     Past Surgical History:  Procedure Laterality Date   WISDOM TOOTH EXTRACTION Bilateral     Family History  Problem Relation Age of Onset   Stroke Paternal Grandfather     Social History:  reports that he has never smoked. He does not have any smokeless tobacco history on file. He reports current drug use. Frequency: 1.00 time per week. Drug:  Marijuana. He reports that he does not drink alcohol.  Allergies: No Known Allergies  Medications: I have reviewed the patient's current medications.  Results for orders placed or performed during the hospital encounter of 07/14/23 (from the past 48 hour(s))  Comprehensive metabolic panel     Status: Abnormal   Collection Time: 07/14/23  2:54 AM  Result Value Ref Range   Sodium 137 135 - 145 mmol/L   Potassium 3.5 3.5 - 5.1 mmol/L   Chloride 99 98 - 111 mmol/L   CO2 24 22 - 32 mmol/L   Glucose, Bld 115 (H) 70 - 99 mg/dL    Comment: Glucose reference range applies only to samples taken after fasting for at least 8 hours.   BUN 12 6 - 20 mg/dL   Creatinine, Ser 5.78 0.61 - 1.24 mg/dL   Calcium 9.9 8.9 - 46.9 mg/dL   Total Protein 7.5 6.5 - 8.1 g/dL   Albumin 4.8 3.5 - 5.0 g/dL   AST 18 15 - 41 U/L   ALT 21 0 - 44 U/L   Alkaline Phosphatase 54 38 - 126 U/L   Total Bilirubin 1.5 (H) 0.3 - 1.2 mg/dL   GFR, Estimated >62 >95 mL/min    Comment: (NOTE) Calculated using the CKD-EPI Creatinine Equation (2021)    Anion gap 14 5 - 15    Comment: Performed at [facility], [address], [place], [place] [address]  CBC     Status: Abnormal   Collection Time: 07/14/23  2:54 AM  Result Value Ref Range   WBC 14.3 (H) 4.0 - 10.5 K/uL   RBC 5.26 4.22 - 5.81 MIL/uL   Hemoglobin 15.3 13.0 - 17.0 g/dL   HCT 16.1 09.6 - 04.5 %   MCV 83.1 80.0 - 100.0 fL   MCH 29.1 26.0 - 34.0 pg   MCHC 35.0 30.0 - 36.0 g/dL   RDW 40.9 81.1 - 91.4 %   Platelets 172 150 - 400 K/uL   nRBC 0.0 0.0 - 0.2 %    Comment: Performed at [facility], [address] [place], [place] [address]  Sedimentation rate     Status: None   Collection Time: 07/14/23  2:54 AM  Result Value Ref Range   Sed Rate 0 0 - 16 mm/hr    Comment: Performed at [facility], [address], [place], [place] [address]  C-reactive protein     Status: None   Collection Time:  07/14/23  2:54 AM  Result Value Ref Range   CRP <0.5 <1.0 mg/dL    Comment: Performed at [facility], [address], [place], [place] [address]  Heparin level (unfractionated)     Status: None   Collection Time: 07/14/23  7:27 PM  Result Value Ref Range   Heparin Unfractionated 0.39 0.30 - 0.70 IU/mL    Comment: (NOTE) The clinical reportable range upper limit is being lowered to >1.10 to align with the FDA approved guidance for the current laboratory assay.  If heparin results are below expected values, and patient dosage has  been confirmed, suggest follow up testing of antithrombin III levels. Performed at [facility], [address] [place], [place] [address]     CT Angio Neck W and/or Wo Contrast  Result Date: 07/14/2023 CLINICAL DATA:  Venous thrombosis.  Septic thrombophlebitis. EXAM: CT ANGIOGRAPHY NECK TECHNIQUE: Multidetector CT imaging of the neck was performed using the standard protocol during bolus administration of intravenous contrast. Multiplanar CT image reconstructions and MIPs were obtained to evaluate the vascular anatomy. Carotid stenosis measurements (when applicable) are obtained utilizing NASCET criteria, using the distal internal carotid diameter as the denominator. RADIATION DOSE REDUCTION: This exam was performed according to the departmental dose-optimization program which includes automated exposure control, adjustment of the mA and/or kV according to patient size and/or use of iterative reconstruction technique. CONTRAST:  75mL OMNIPAQUE IOHEXOL 350 MG/ML SOLN COMPARISON:  CT of the temporal bones and brain MRI from earlier the same day FINDINGS: Aortic arch: Normal Right carotid system: Widely patent and smoothly contoured. Left carotid system: Widely patent and smoothly contoured. Vertebral arteries: Widely patent and smoothly contoured. Skeleton: Otomastoiditis on the left, known. Other neck: The patient's left IJ thrombus visibly terminates  just below the jugular foramen. No inflammation or venous thrombosis seen in the neck. Upper chest: Clear. Intracranial findings as described on recent brain MRI. IMPRESSION: Septic thrombophlebitis affects only a short segment of the left IJ. No inflammation or collection seen in the neck. Intracranial findings as described on preceding brain MRI. Electronically Signed   By: Tiburcio Pea M.D.   On: 07/14/2023 11:13   MR Brain W and Wo Contrast  Result Date: 07/14/2023 CLINICAL DATA:  Concern for otomastoiditis.  Abnormal head CT. EXAM: MRI HEAD WITHOUT AND WITH CONTRAST TECHNIQUE: Multiplanar, multiecho pulse sequences of the brain and surrounding structures were obtained without and with intravenous contrast. CONTRAST:  10mL GADAVIST GADOBUTROL 1 MMOL/ML IV SOLN COMPARISON:  Temporal bone CT from  earlier today FINDINGS: Brain: Epidural empyema along the left mastoid which restricts diffusion and uplifts the thrombosed transverse sigmoid junction with clot continuing into the upper left internal jugular vein. Abscess along the posteromedial mastoid measures 2.3 x 0.6 x 2.4 cm. Contiguous component which tracks superiorly along the posterior aspect of the temporal bone. Mild mass effect on the left lateral cerebellum without complicating infarct. No hydrocephalus. No generalized subarachnoid spread of infection seen. 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 Skull and upper cervical spine: Negative Sinuses/Orbits: As above.  Clear sinuses and negative orbits. Other: These results were called by telephone at the time of interpretation on 07/14/2023 at 10:36 am to provider [facility] , who verbally acknowledged these results. IMPRESSION: Complicated otomastoiditis with epidural empyema uplifting the thrombosed transverse sigmoid dural sinus with continuation into the left upper IJ. Electronically Signed   By: Tiburcio Pea M.D.   On: 07/14/2023 10:42   CT Temporal Bones Wo  Contrast  Result Date: 07/14/2023 CLINICAL DATA:  Motor itis media with complication suspected. Headache with nausea and vomiting. Fever and chills. EXAM: CT TEMPORAL BONES WITHOUT CONTRAST TECHNIQUE: Axial and coronal plane CT imaging of the petrous temporal bones was performed with thin-collimation image reconstruction. No intravenous contrast was administered. Multiplanar CT image reconstructions were also generated. RADIATION DOSE REDUCTION: This exam was performed according to the departmental dose-optimization program which includes automated exposure control, adjustment of the mA and/or kV according to patient size and/or use of iterative reconstruction technique. COMPARISON:  05/30/2022 FINDINGS: RIGHT TEMPORAL BONE External auditory canal: Normal. Middle ear cavity: Normally aerated. The scutum and ossicles are normal. The tegmen tympani is intact. Inner ear structures: The cochlea, vestibule and semicircular canals are normal. The vestibular aqueduct is not enlarged. Internal auditory and facial nerve canals:  Normal Mastoid air cells: Normal pneumatization and diffuse aeration LEFT TEMPORAL BONE External auditory canal: Diffuse opacification by soft tissue density. No erosion seen. Middle ear cavity: Complete opacification.  No erosion detected. Inner ear structures: Normal formation and density. Internal auditory and facial nerve canals:  Unremarkable Mastoid air cells: Confluent opacification.  No coalescence/erosion. Vascular: No hyperdense vessel. Limited intracranial: Vague low-density along the inner table at the left temporal bone, see 5: 10 markings. Visible orbits/paranasal sinuses: Negative Soft tissues: No evidence of acute inflammation below the left mastoid. IMPRESSION: Otomastoiditis on the left with diffuse opacification since 05/30/2022. Vague low-density along the inner table of the left temporal bone, recommend postcontrast CT assessment versus MRI. Electronically Signed   By: Tiburcio Pea M.D.   On: 07/14/2023 05:50    ROS:ROS  Blood pressure 132/69, pulse 78, temperature 100 F (37.8 C), temperature source Oral, resp. rate 18, height 5\' 11"  (1.803 m), weight 86 kg, SpO2 99%.  PHYSICAL EXAM:  CONSTITUTIONAL: well developed, nourished, no distress and alert and oriented x 3 PULMONARY/CHEST WALL: effort normal and no stridor, no stertor, no dysphonia HENT: Head : normocephalic and atraumatic Ears: Right ear:   canal normal, external ear normal and normal-appearing external auditory canal, with no evidence of canal edema.  Tympanic membrane intact with no evidence of effusion or infection. Left ear: Copious purulent otorrhea draining from external auditory canal.  Ear wick in place with moderate canal edema.  No evidence of edema over mastoid, nontender to palpation. Nose: nose normal and no purulence Mouth/Throat:  Mouth: uvula midline and no oral lesions Throat: oropharynx clear and moist Mucous membranes: normal EYES: conjunctiva normal, EOM normal and PERRL  NECK: Appreciable edema and mild tenderness to palpation over the left neck.  Studies Reviewed: CT temporal bone personally reviewed, demonstrates complete opacification of the left middle ear space and mastoid, with no evidence of mastoid air cell coalescence.  There is also significant edema of the left external auditory canal.  The right mastoid and middle ear space appear healthy and normal.  Assessment/Plan: KADIR AZUCENA is a 22 y.o M with left acute otitis media complicated by spontaneous tympanic membrane perforation and thrombophlebitis of dural sinus and left upper internal jugular vein.  Despite complete opacification of the mastoid air cells and middle ear space, there is no evidence of mastoid air cell coalescence or symptoms to indicate acute mastoiditis.  As patient's middle ear is actively draining, no indication for immediate surgical intervention at this time.  Recommend continued treatment with  IV antibiotics, as well as application of Ciprodex drops, 4 drops to the left ear twice daily for the next 14 days.  Recommend trending leukocytosis or signs of improvement.  Anticoagulation as per hospital medicine.  Patient will ultimately require ENT follow-up for monitoring of the tympanic membrane perforation, as well as a formal audiogram.  Will monitor for canal edema to resolve so that tympanic membrane and middle ear can be assessed  Will continue to follow, if there is any sudden change in status please notify ENT immediately.   Thank you for allowing me to participate in the care of this patient. Please do not hesitate to contact me with any questions or concerns.   Laren Boom, DO Otolaryngology [facility] ENT Cell: [phone]   07/14/2023, 8:28 PM

## 2023-07-14 NOTE — ED Provider Notes (Signed)
Patient transferred from [facility] due to persistent drainage from his left ear now with bloody drainage, nausea and vomiting.  Imaging concerning for mastoiditis but also possible deeper infection.  Dr. Jenne Pane with ENT recommended MRI, Unasyn and ENT consult once MRI is finished.  Patient had recently received morphine and Zofran and at this time feels okay.  He does not need any sedation for MRI. Called after MRI and it does show that patient has a deep evidence of abscess but also a sinus thrombosis that goes into the IJ.  Patient is noted to have swelling in the left side of his neck and they recommended a CTA.  Findings discussed with the patient and his family member who is present at bedside.  Patient has already received antibiotics.  ENT consult pending.  12:08 PM Spoke with ENT on the phone and at this time patient does not need surgery.  She recommended placing an ear wick and doing Ciprodex drops 4 drops twice a day which were initiated here.  He has already received broad-spectrum antibiotics and that needs to continue.  Will consult hospitalist for admission and discuss whether patient needs heparin for his venous thrombosis from septic thrombophlebitis.  Findings discussed with his mom and the patient.  Patient requires admission and will be admitted to hospitalist service.  Pt started on heparin  CRITICAL CARE Performed by: Crystelle Ferrufino Total critical care time: 30 minutes Critical care time was exclusive of separately billable procedures and treating other patients. Critical care was necessary to treat or prevent imminent or life-threatening deterioration. Critical care was time spent personally by me on the following activities: development of treatment plan with patient and/or surrogate as well as nursing, discussions with consultants, evaluation of patient's response to treatment, examination of patient, obtaining history from patient or surrogate, ordering and  performing treatments and interventions, ordering and review of laboratory studies, ordering and review of radiographic studies, pulse oximetry and re-evaluation of patient's condition.    Gwyneth Sprout, MD 07/14/23 [phone]

## 2023-07-14 NOTE — ED Notes (Signed)
Patient transported to MRI 

## 2023-07-14 NOTE — ED Notes (Addendum)
Pt updated that [facility] was called at [phone] for transfer.

## 2023-07-14 NOTE — ED Notes (Signed)
ED TO INPATIENT HANDOFF REPORT  ED Nurse Name and Phone #: Lenell Antu Name/Age/Gender Caleb Mendoza 22 y.o. male Room/Bed: H022C/H022C  Code Status   Code Status: Full Code  Home/SNF/Other Home Patient oriented to: self, place, time, and situation Is this baseline? Yes   Triage Complete: Triage complete  Chief Complaint Mastoiditis [H70.90]  Triage Note Pt has a ruptured eardrum; has been having increased headache with NV, fever/chills since Saturday   Pt transferred form [facility] via [facility] for an MRI.  VSS.   Allergies No Known Allergies  Level of Care/Admitting Diagnosis ED Disposition     ED Disposition  Admit   Condition  --   Comment  Hospital Area: [facility] [[id number]]  Level of Care: Telemetry Surgical [105]  May place patient in observation at [facility] or [facility] if equivalent level of care is available:: No  Covid Evaluation: Asymptomatic - no recent exposure (last 10 days) testing not required  Diagnosis: Mastoiditis [242216]  Admitting Physician: Synetta Fail [[id number]]  Attending Physician: Synetta Fail [phone]          B Medical/Surgery History Past Medical History:  Diagnosis Date   Left-sided Bell's palsy    Past Surgical History:  Procedure Laterality Date   WISDOM TOOTH EXTRACTION Bilateral      A IV Location/Drains/Wounds Patient Lines/Drains/Airways Status     Active Line/Drains/Airways     Name Placement date Placement time Site Days   Peripheral IV 07/14/23 20 G Right Antecubital 07/14/23  0255  Antecubital  less than 1            Intake/Output Last 24 hours  Intake/Output Summary (Last 24 hours) at 07/14/2023 1242 Last data filed at 07/14/2023 0709 Gross per 24 hour  Intake 100 ml  Output --  Net 100 ml    Labs/Imaging Results for orders placed or performed during the hospital encounter of 07/14/23 (from the past 48 hour(s))  Comprehensive metabolic panel      Status: Abnormal   Collection Time: 07/14/23  2:54 AM  Result Value Ref Range   Sodium 137 135 - 145 mmol/L   Potassium 3.5 3.5 - 5.1 mmol/L   Chloride 99 98 - 111 mmol/L   CO2 24 22 - 32 mmol/L   Glucose, Bld 115 (H) 70 - 99 mg/dL    Comment: Glucose reference range applies only to samples taken after fasting for at least 8 hours.   BUN 12 6 - 20 mg/dL   Creatinine, Ser 8.10 0.61 - 1.24 mg/dL   Calcium 9.9 8.9 - 17.5 mg/dL   Total Protein 7.5 6.5 - 8.1 g/dL   Albumin 4.8 3.5 - 5.0 g/dL   AST 18 15 - 41 U/L   ALT 21 0 - 44 U/L   Alkaline Phosphatase 54 38 - 126 U/L   Total Bilirubin 1.5 (H) 0.3 - 1.2 mg/dL   GFR, Estimated >10 >25 mL/min    Comment: (NOTE) Calculated using the CKD-EPI Creatinine Equation (2021)    Anion gap 14 5 - 15    Comment: Performed at [facility], [address] [place], [place] [address]  CBC     Status: Abnormal   Collection Time: 07/14/23  2:54 AM  Result Value Ref Range   WBC 14.3 (H) 4.0 - 10.5 K/uL   RBC 5.26 4.22 - 5.81 MIL/uL   Hemoglobin 15.3 13.0 - 17.0 g/dL   HCT 82.4 23.5 - 36.1 %   MCV  83.1 80.0 - 100.0 fL   MCH 29.1 26.0 - 34.0 pg   MCHC 35.0 30.0 - 36.0 g/dL   RDW 16.1 09.6 - 04.5 %   Platelets 172 150 - 400 K/uL   nRBC 0.0 0.0 - 0.2 %    Comment: Performed at [facility], [address] [place], [place] [address]  Sedimentation rate     Status: None   Collection Time: 07/14/23  2:54 AM  Result Value Ref Range   Sed Rate 0 0 - 16 mm/hr    Comment: Performed at [facility], [address], [place], [place] [address]  C-reactive protein     Status: None   Collection Time: 07/14/23  2:54 AM  Result Value Ref Range   CRP <0.5 <1.0 mg/dL    Comment: Performed at [facility], [address] [place], [place] [address]   CT Angio Neck W and/or Wo Contrast  Result Date: 07/14/2023 CLINICAL DATA:  Venous thrombosis.  Septic thrombophlebitis. EXAM: CT ANGIOGRAPHY  NECK TECHNIQUE: Multidetector CT imaging of the neck was performed using the standard protocol during bolus administration of intravenous contrast. Multiplanar CT image reconstructions and MIPs were obtained to evaluate the vascular anatomy. Carotid stenosis measurements (when applicable) are obtained utilizing NASCET criteria, using the distal internal carotid diameter as the denominator. RADIATION DOSE REDUCTION: This exam was performed according to the departmental dose-optimization program which includes automated exposure control, adjustment of the mA and/or kV according to patient size and/or use of iterative reconstruction technique. CONTRAST:  75mL OMNIPAQUE IOHEXOL 350 MG/ML SOLN COMPARISON:  CT of the temporal bones and brain MRI from earlier the same day FINDINGS: Aortic arch: Normal Right carotid system: Widely patent and smoothly contoured. Left carotid system: Widely patent and smoothly contoured. Vertebral arteries: Widely patent and smoothly contoured. Skeleton: Otomastoiditis on the left, known. Other neck: The patient's left IJ thrombus visibly terminates just below the jugular foramen. No inflammation or venous thrombosis seen in the neck. Upper chest: Clear. Intracranial findings as described on recent brain MRI. IMPRESSION: Septic thrombophlebitis affects only a short segment of the left IJ. No inflammation or collection seen in the neck. Intracranial findings as described on preceding brain MRI. Electronically Signed   By: Tiburcio Pea M.D.   On: 07/14/2023 11:13   MR Brain W and Wo Contrast  Result Date: 07/14/2023 CLINICAL DATA:  Concern for otomastoiditis.  Abnormal head CT. EXAM: MRI HEAD WITHOUT AND WITH CONTRAST TECHNIQUE: Multiplanar, multiecho pulse sequences of the brain and surrounding structures were obtained without and with intravenous contrast. CONTRAST:  10mL GADAVIST GADOBUTROL 1 MMOL/ML IV SOLN COMPARISON:  Temporal bone CT from earlier today FINDINGS: Brain: Epidural  empyema along the left mastoid which restricts diffusion and uplifts the thrombosed transverse sigmoid junction with clot continuing into the upper left internal jugular vein. Abscess along the posteromedial mastoid measures 2.3 x 0.6 x 2.4 cm. Contiguous component which tracks superiorly along the posterior aspect of the temporal bone. Mild mass effect on the left lateral cerebellum without complicating infarct. No hydrocephalus. No generalized subarachnoid spread of infection seen. 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 Skull and upper cervical spine: Negative Sinuses/Orbits: As above.  Clear sinuses and negative orbits. Other: These results were called by telephone at the time of interpretation on 07/14/2023 at 10:36 am to provider [facility] , who verbally acknowledged these results. IMPRESSION: Complicated otomastoiditis with epidural empyema uplifting the thrombosed transverse sigmoid dural sinus with  continuation into the left upper IJ. Electronically Signed   By: Tiburcio Pea M.D.   On: 07/14/2023 10:42   CT Temporal Bones Wo Contrast  Result Date: 07/14/2023 CLINICAL DATA:  Motor itis media with complication suspected. Headache with nausea and vomiting. Fever and chills. EXAM: CT TEMPORAL BONES WITHOUT CONTRAST TECHNIQUE: Axial and coronal plane CT imaging of the petrous temporal bones was performed with thin-collimation image reconstruction. No intravenous contrast was administered. Multiplanar CT image reconstructions were also generated. RADIATION DOSE REDUCTION: This exam was performed according to the departmental dose-optimization program which includes automated exposure control, adjustment of the mA and/or kV according to patient size and/or use of iterative reconstruction technique. COMPARISON:  05/30/2022 FINDINGS: RIGHT TEMPORAL BONE External auditory canal: Normal. Middle ear cavity: Normally aerated. The scutum and ossicles are normal. The tegmen tympani is  intact. Inner ear structures: The cochlea, vestibule and semicircular canals are normal. The vestibular aqueduct is not enlarged. Internal auditory and facial nerve canals:  Normal Mastoid air cells: Normal pneumatization and diffuse aeration LEFT TEMPORAL BONE External auditory canal: Diffuse opacification by soft tissue density. No erosion seen. Middle ear cavity: Complete opacification.  No erosion detected. Inner ear structures: Normal formation and density. Internal auditory and facial nerve canals:  Unremarkable Mastoid air cells: Confluent opacification.  No coalescence/erosion. Vascular: No hyperdense vessel. Limited intracranial: Vague low-density along the inner table at the left temporal bone, see 5: 10 markings. Visible orbits/paranasal sinuses: Negative Soft tissues: No evidence of acute inflammation below the left mastoid. IMPRESSION: Otomastoiditis on the left with diffuse opacification since 05/30/2022. Vague low-density along the inner table of the left temporal bone, recommend postcontrast CT assessment versus MRI. Electronically Signed   By: Tiburcio Pea M.D.   On: 07/14/2023 05:50    Pending Labs Unresulted Labs (From admission, onward)     Start     Ordered   07/15/23 0500  Comprehensive metabolic panel  Tomorrow morning,   R        07/14/23 1232   07/15/23 0500  CBC  Tomorrow morning,   R        07/14/23 1232   07/14/23 1224  HIV Antibody (routine testing w rflx)  (HIV Antibody (Routine testing w reflex) panel)  Once,   R        07/14/23 1232            Vitals/Pain Today's Vitals   07/14/23 0749 07/14/23 0839 07/14/23 0849 07/14/23 1210  BP: 118/69  133/68 131/75  Pulse: (!) 102  (!) 101 (!) 115  Resp: 18  18 18   Temp: 98.6 F (37 C)   (!) 101 F (38.3 C)  TempSrc: Oral     SpO2: 99%  100% 100%  PainSc: 7  6       Isolation Precautions No active isolations  Medications Medications  morphine (PF) 4 MG/ML injection (  Not Given 07/14/23 0757)  sodium  chloride flush (NS) 0.9 % injection 3 mL (has no administration in time range)  acetaminophen (TYLENOL) tablet 650 mg (has no administration in time range)    Or  acetaminophen (TYLENOL) suppository 650 mg (has no administration in time range)  HYDROcodone-acetaminophen (NORCO/VICODIN) 5-325 MG per tablet 1 tablet (has no administration in time range)  HYDROmorphone (DILAUDID) injection 0.5-1 mg (has no administration in time range)  polyethylene glycol (MIRALAX / GLYCOLAX) packet 17 g (has no administration in time range)  ondansetron (ZOFRAN) tablet 4 mg (has no administration in time range)  Or  ondansetron (ZOFRAN) injection 4 mg (has no administration in time range)  Ampicillin-Sulbactam (UNASYN) 3 g in sodium chloride 0.9 % 100 mL IVPB (has no administration in time range)  ciprofloxacin-dexamethasone (CIPRODEX) 0.3-0.1 % OTIC (EAR) suspension 4 drop (has no administration in time range)  ondansetron (ZOFRAN) injection 4 mg (4 mg Intravenous Given 07/14/23 0256)  0.9 %  sodium chloride infusion (0 mLs Intravenous Stopped 07/14/23 0428)  ketorolac (TORADOL) 30 MG/ML injection 30 mg (30 mg Intravenous Given 07/14/23 0428)  Ampicillin-Sulbactam (UNASYN) 3 g in sodium chloride 0.9 % 100 mL IVPB (0 g Intravenous Stopped 07/14/23 0709)  fentaNYL (SUBLIMAZE) injection 50 mcg (50 mcg Intravenous Given 07/14/23 0637)  morphine (PF) 4 MG/ML injection 4 mg (4 mg Intravenous Given 07/14/23 0756)  ondansetron (ZOFRAN) injection 4 mg (4 mg Intravenous Given 07/14/23 0756)  metoCLOPramide (REGLAN) injection 10 mg (10 mg Intravenous Given 07/14/23 0923)  gadobutrol (GADAVIST) 1 MMOL/ML injection 10 mL (10 mLs Intravenous Contrast Given 07/14/23 1009)  iohexol (OMNIPAQUE) 350 MG/ML injection 75 mL (75 mLs Intravenous Contrast Given 07/14/23 1106)  morphine (PF) 4 MG/ML injection 4 mg (4 mg Intravenous Given 07/14/23 1229)  acetaminophen (TYLENOL) tablet 1,000 mg (1,000 mg Oral Given 07/14/23 1230)     Mobility walks     Focused Assessments     R Recommendations: See Admitting Provider Note  Report given to:   Additional Notes:

## 2023-07-14 NOTE — Progress Notes (Signed)
ANTICOAGULATION CONSULT NOTE - Initial Consult  Pharmacy Consult for heparin  Indication:  venous sinus thrombosis   No Known Allergies  Patient Measurements: Height: 5\' 11"  (180.3 cm) Weight: 86 kg (189 lb 9.5 oz) IBW/kg (Calculated) : 75.3 Heparin Dosing Weight: 86kg   Vital Signs: Temp: 101 F (38.3 C) (07/22 1210) Temp Source: Oral (07/22 0749) BP: 131/75 (07/22 1210) Pulse Rate: 115 (07/22 1210)  Labs: Recent Labs    07/14/23 0254  HGB 15.3  HCT 43.7  PLT 172  CREATININE 0.83    Estimated Creatinine Clearance: 148.7 mL/min (by C-G formula based on SCr of 0.83 mg/dL).   Medical History: Past Medical History:  Diagnosis Date   Left-sided Bell's palsy     Assessment: Patient admitted with CC of persistent drainage from left ear. Initial imaging concerning for mastoiditis. MRI indicating sinus thrombosis that goes into the internal jugular. Pharmacy consulted to dose heparin.  HgB 14.3 and PLTS 172.   Not on anticoagulation PTA.   Goal of Therapy: Heparin level 0.3-0.7 units/ml Monitor platelets by anticoagulation protocol: Yes   Plan:  Give 3000 units bolus x 1 Start heparin infusion at 1400 units/hr Check anti-Xa level in 6 hours and daily while on heparin Continue to monitor H&H and platelets  Estill Batten, PharmD, BCCCP  07/14/2023,12:48 PM

## 2023-07-14 NOTE — ED Provider Notes (Signed)
[place] EMERGENCY DEPARTMENT AT [facility]  Provider Note  CSN: [id number] Arrival date & time: 07/14/23 0230  History Chief Complaint  Patient presents with   Headache    Caleb Mendoza is a 22 y.o. male with remote history of left sided bell's palsy reports he was diagnosed with an ear infection early in [place] while [place] the beach and after a couple of days of Amoxil his ear drum ruptured. He has since had persistent purulent drainage from the left ear, given Rx for antibiotic drops he is unsure the name of. Tonight he began to have a headache and nausea and after vomiting several times, noticed blood in his L ear. Denies fever but has felt chills.    Home Medications Prior to Admission medications   Medication Sig Start Date End Date Taking? Authorizing Provider  cephALEXin (KEFLEX) 500 MG capsule Take 1 capsule (500 mg total) by mouth 4 (four) times daily. Patient not taking: Reported on 08/21/2022 09/08/12   Molpus, Jonny Ruiz, MD  ondansetron (ZOFRAN-ODT) 4 MG disintegrating tablet Take 1 tablet (4 mg total) by mouth every 8 (eight) hours as needed for nausea or vomiting. Patient not taking: Reported on 08/21/2022 05/30/22   Silva Bandy, PA-C     Allergies    Patient has no known allergies.   Review of Systems   Review of Systems Please see HPI for pertinent positives and negatives  Physical Exam BP 112/67 (BP Location: Right Arm)   Pulse 95   Temp 98.4 F (36.9 C) (Oral)   Resp 16   SpO2 100%   Physical Exam Vitals and nursing note reviewed.  Constitutional:      Appearance: Normal appearance.  HENT:     Head: Normocephalic and atraumatic.     Ears:     Comments: Marked edema and purulent discharge from L ear. Unable to visualize canal or TM.     Nose: Nose normal.     Mouth/Throat:     Mouth: Mucous membranes are moist.  Eyes:     Extraocular Movements: Extraocular movements intact.     Conjunctiva/sclera: Conjunctivae normal.  Cardiovascular:      Rate and Rhythm: Normal rate.  Pulmonary:     Effort: Pulmonary effort is normal.     Breath sounds: Normal breath sounds.  Abdominal:     General: Abdomen is flat.     Palpations: Abdomen is soft.     Tenderness: There is no abdominal tenderness.  Musculoskeletal:        General: No swelling. Normal range of motion.     Cervical back: Neck supple.  Skin:    General: Skin is warm and dry.  Neurological:     General: No focal deficit present.     Mental Status: He is alert.  Psychiatric:        Mood and Affect: Mood normal.     ED Results / Procedures / Treatments   EKG None  Procedures Procedures  Medications Ordered in the ED Medications  Ampicillin-Sulbactam (UNASYN) 3 g in sodium chloride 0.9 % 100 mL IVPB (3 g Intravenous New Bag/Given 07/14/23 0640)  ondansetron (ZOFRAN) injection 4 mg (4 mg Intravenous Given 07/14/23 0256)  0.9 %  sodium chloride infusion (0 mLs Intravenous Stopped 07/14/23 0428)  ketorolac (TORADOL) 30 MG/ML injection 30 mg (30 mg Intravenous Given 07/14/23 0428)  fentaNYL (SUBLIMAZE) injection 50 mcg (50 mcg Intravenous Given 07/14/23 4098)    Initial Impression and Plan  Patient with persistent  L ear drainage after oral and topical Abx. Concern for malignant otitis, although he is otherwise healthy. Labs done in triage show leukocytosis, normal BMP. Will add inflammatory markers. Send for CT temporal bones. Toradol for pain.   ED Course   Clinical Course as of 07/14/23 0641  Mon Jul 14, 2023  0540 Sed rate is 0, reassuring.  [CS]  0607 I personally viewed the images from radiology studies and agree with radiologist interpretation:  CT concerning for otomastoiditis as well as a lucency near the inner table. Will discuss with ENT.  [CS]  [phone] Spoke with Dr. Jenne Pane, ENT, reviewed clinical presentation and imaging findings. He recommends transfer to [facility] for MRI and consultation with the ENT doctor covering during the day when that has been done. In the  meantime, he recommends a dose of Unasyn as well. Reviewed this plan with the patient who is amenable. Additional pain medications ordered for comfort.  [CS]    Clinical Course User Index [CS] Pollyann Savoy, MD     MDM Rules/Calculators/A&P Medical Decision Making Problems Addressed: Mastoiditis of left side: acute illness or injury  Amount and/or Complexity of Data Reviewed Labs: ordered. Decision-making details documented in ED Course. Radiology: ordered and independent interpretation performed. Decision-making details documented in ED Course.  Risk Prescription drug management. Parenteral controlled substances. Decision regarding hospitalization.     Final Clinical Impression(s) / ED Diagnoses Final diagnoses:  Mastoiditis of left side    Rx / DC Orders ED Discharge Orders     None        Pollyann Savoy, MD 07/14/23 [phone]

## 2023-07-14 NOTE — Progress Notes (Signed)
ANTICOAGULATION CONSULT NOTE - Follow Up Consult  Pharmacy Consult for heparin  Indication:  venous sinus thrombosis   No Known Allergies  Patient Measurements: Height: 5\' 11"  (180.3 cm) Weight: 86 kg (189 lb 9.5 oz) IBW/kg (Calculated) : 75.3 Heparin Dosing Weight: 86kg   Vital Signs: Temp: 100 F (37.8 C) (07/22 1946) Temp Source: Oral (07/22 1946) BP: 132/69 (07/22 1946) Pulse Rate: 78 (07/22 1946)  Labs: Recent Labs    07/14/23 0254 07/14/23 1927  HGB 15.3  --   HCT 43.7  --   PLT 172  --   HEPARINUNFRC  --  0.39  CREATININE 0.83  --     Estimated Creatinine Clearance: 148.7 mL/min (by C-G formula based on SCr of 0.83 mg/dL).   Medical History: Past Medical History:  Diagnosis Date   Left-sided Bell's palsy     Assessment: Patient admitted with CC of persistent drainage from left ear. Initial imaging concerning for mastoiditis. MRI indicating sinus thrombosis that goes into the internal jugular. Pharmacy consulted to dose heparin.  HgB 14.3 and PLTS 172.   Not on anticoagulation PTA.   First heparin level is therapeutic at 0.39 on heparin 1400 units/hr. No bleeding or issues with the infusion reported.  Goal of Therapy: Heparin level 0.3-0.7 units/ml Monitor platelets by anticoagulation protocol: Yes   Plan:  Continue heparin infusion at 1400 units/hr Check confirmatory heparin level in 6hrs at 01:30 Check daily heparin level and CBC Continue to monitor for signs/symptoms of bleeding  Loralee Pacas, PharmD, BCPS 07/14/2023,8:20 PM  Please check AMION for all [facility] phone numbers After 10:00 PM, call Main Pharmacy [phone]

## 2023-07-14 NOTE — ED Notes (Signed)
Pt reports feeling hot, notedto be pale. Blood noted to L ear that pt reports started today

## 2023-07-14 NOTE — ED Triage Notes (Signed)
Pt transferred form [facility] via [facility] for an MRI.  VSS.

## 2023-07-14 NOTE — ED Notes (Signed)
Pt had episode of emesis.  EDP notified

## 2023-07-14 NOTE — H&P (Signed)
History and Physical   Caleb Mendoza MWU:[id number] DOB: 12/06/2001 DOA: 07/14/2023  PCP: Irven Coe, MD   Patient coming from: Home  Chief Complaint: Headache, ear drainage  HPI: Caleb Mendoza is a 22 y.o. male with medical history significant of Bell's palsy presenting with headache, ear drainage, nausea.  Patient was diagnosed with an ear infection in early July.  And was taking some amoxicillin and after a few days his eardrum ruptured and has had persistent purulent drainage since that time.  PCP prescribed ear antibiotic drops but he is not sure which.  Today he developed headache with nausea vomiting and blood drainage from the ear.  He reports some chills as well.  But has not noted fever at home.  He further denies chest pain, shortness of breath, abdominal pain, constipation, diarrhea.  ED Course: Signs in the ED notable for Tmax of 101, heart rate in the 80s to 110s.  Lab workup included CMP with glucose 115, T. bili 1.5.  CBC with leukocytosis to 14.3.  CRP and ESR negative.  CT of the temporal bone showed otomastoiditis with diffuse opacification and changes at the temporal bone with recommendation for MRI follow-up.  Patient transferred from med center to ED for MRI and further workup.  MRI showed otomastoiditis with epidural empyema, thrombophlebitis of dural sinus with thrombosis of dural sinus.  CTA of the neck showed septic thrombophlebitis extending into the upper left IJ.  Patient received Unasyn, fentanyl, Toradol, morphine, Reglan, Zofran, liter of IV fluids in the ED.  ENT was consulted and recommended the above Unasyn and MRI after it or further imaging recommended airway, Ciprodex and continue with IV antibiotics but currently no indication for surgery as ears draining.  Review of Systems: As per HPI otherwise all other systems reviewed and are negative.  Past Medical History:  Diagnosis Date   Left-sided Bell's palsy     Past Surgical History:  Procedure  Laterality Date   WISDOM TOOTH EXTRACTION Bilateral     Social History  reports that he has never smoked. He does not have any smokeless tobacco history on file. He reports current drug use. Frequency: 1.00 time per week. Drug: Marijuana. He reports that he does not drink alcohol.  No Known Allergies  Family History  Problem Relation Age of Onset   Stroke Paternal Grandfather   Review on admission  Prior to Admission medications   Not on File    Physical Exam: Vitals:   07/14/23 0636 07/14/23 0749 07/14/23 0849 07/14/23 1210  BP: 112/67 118/69 133/68 131/75  Pulse: 95 (!) 102 (!) 101 (!) 115  Resp: 16 18 18 18   Temp:  98.6 F (37 C)  (!) 101 F (38.3 C)  TempSrc:  Oral    SpO2: 100% 99% 100% 100%    Physical Exam Constitutional:      General: He is not in acute distress.    Appearance: Normal appearance.  HENT:     Head: Normocephalic and atraumatic.     Left Ear: Drainage present.     Mouth/Throat:     Mouth: Mucous membranes are moist.     Pharynx: Oropharynx is clear.  Eyes:     Extraocular Movements: Extraocular movements intact.     Pupils: Pupils are equal, round, and reactive to light.  Cardiovascular:     Rate and Rhythm: Normal rate and regular rhythm.     Pulses: Normal pulses.     Heart sounds: Normal heart sounds.  Pulmonary:  Effort: Pulmonary effort is normal. No respiratory distress.     Breath sounds: Normal breath sounds.  Abdominal:     General: Bowel sounds are normal. There is no distension.     Palpations: Abdomen is soft.     Tenderness: There is no abdominal tenderness.  Musculoskeletal:        General: No swelling or deformity.  Skin:    General: Skin is warm and dry.  Neurological:     General: No focal deficit present.     Mental Status: Mental status is at baseline.    Labs on Admission: I have personally reviewed following labs and imaging studies  CBC: Recent Labs  Lab 07/14/23 0254  WBC 14.3*  HGB 15.3  HCT 43.7   MCV 83.1  PLT 172    Basic Metabolic Panel: Recent Labs  Lab 07/14/23 0254  NA 137  K 3.5  CL 99  CO2 24  GLUCOSE 115*  BUN 12  CREATININE 0.83  CALCIUM 9.9    GFR: CrCl cannot be calculated (Unknown ideal weight.).  Liver Function Tests: Recent Labs  Lab 07/14/23 0254  AST 18  ALT 21  ALKPHOS 54  BILITOT 1.5*  PROT 7.5  ALBUMIN 4.8    Urine analysis:    Component Value Date/Time   COLORURINE YELLOW 05/30/2022 2138   APPEARANCEUR CLEAR 05/30/2022 2138   LABSPEC 1.041 (H) 05/30/2022 2138   PHURINE 7.0 05/30/2022 2138   GLUCOSEU NEGATIVE 05/30/2022 2138   HGBUR TRACE (A) 05/30/2022 2138   BILIRUBINUR NEGATIVE 05/30/2022 2138   KETONESUR 15 (A) 05/30/2022 2138   PROTEINUR TRACE (A) 05/30/2022 2138   NITRITE NEGATIVE 05/30/2022 2138   LEUKOCYTESUR NEGATIVE 05/30/2022 2138    Radiological Exams on Admission: CT Angio Neck W and/or Wo Contrast  Result Date: 07/14/2023 CLINICAL DATA:  Venous thrombosis.  Septic thrombophlebitis. EXAM: CT ANGIOGRAPHY NECK TECHNIQUE: Multidetector CT imaging of the neck was performed using the standard protocol during bolus administration of intravenous contrast. Multiplanar CT image reconstructions and MIPs were obtained to evaluate the vascular anatomy. Carotid stenosis measurements (when applicable) are obtained utilizing NASCET criteria, using the distal internal carotid diameter as the denominator. RADIATION DOSE REDUCTION: This exam was performed according to the departmental dose-optimization program which includes automated exposure control, adjustment of the mA and/or kV according to patient size and/or use of iterative reconstruction technique. CONTRAST:  75mL OMNIPAQUE IOHEXOL 350 MG/ML SOLN COMPARISON:  CT of the temporal bones and brain MRI from earlier the same day FINDINGS: Aortic arch: Normal Right carotid system: Widely patent and smoothly contoured. Left carotid system: Widely patent and smoothly contoured. Vertebral  arteries: Widely patent and smoothly contoured. Skeleton: Otomastoiditis on the left, known. Other neck: The patient's left IJ thrombus visibly terminates just below the jugular foramen. No inflammation or venous thrombosis seen in the neck. Upper chest: Clear. Intracranial findings as described on recent brain MRI. IMPRESSION: Septic thrombophlebitis affects only a short segment of the left IJ. No inflammation or collection seen in the neck. Intracranial findings as described on preceding brain MRI. Electronically Signed   By: Tiburcio Pea M.D.   On: 07/14/2023 11:13   MR Brain W and Wo Contrast  Result Date: 07/14/2023 CLINICAL DATA:  Concern for otomastoiditis.  Abnormal head CT. EXAM: MRI HEAD WITHOUT AND WITH CONTRAST TECHNIQUE: Multiplanar, multiecho pulse sequences of the brain and surrounding structures were obtained without and with intravenous contrast. CONTRAST:  10mL GADAVIST GADOBUTROL 1 MMOL/ML IV SOLN COMPARISON:  Temporal bone  CT from earlier today FINDINGS: Brain: Epidural empyema along the left mastoid which restricts diffusion and uplifts the thrombosed transverse sigmoid junction with clot continuing into the upper left internal jugular vein. Abscess along the posteromedial mastoid measures 2.3 x 0.6 x 2.4 cm. Contiguous component which tracks superiorly along the posterior aspect of the temporal bone. Mild mass effect on the left lateral cerebellum without complicating infarct. No hydrocephalus. No generalized subarachnoid spread of infection seen. 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 Skull and upper cervical spine: Negative Sinuses/Orbits: As above.  Clear sinuses and negative orbits. Other: These results were called by telephone at the time of interpretation on 07/14/2023 at 10:36 am to provider [facility] , who verbally acknowledged these results. IMPRESSION: Complicated otomastoiditis with epidural empyema uplifting the thrombosed transverse sigmoid  dural sinus with continuation into the left upper IJ. Electronically Signed   By: Tiburcio Pea M.D.   On: 07/14/2023 10:42   CT Temporal Bones Wo Contrast  Result Date: 07/14/2023 CLINICAL DATA:  Motor itis media with complication suspected. Headache with nausea and vomiting. Fever and chills. EXAM: CT TEMPORAL BONES WITHOUT CONTRAST TECHNIQUE: Axial and coronal plane CT imaging of the petrous temporal bones was performed with thin-collimation image reconstruction. No intravenous contrast was administered. Multiplanar CT image reconstructions were also generated. RADIATION DOSE REDUCTION: This exam was performed according to the departmental dose-optimization program which includes automated exposure control, adjustment of the mA and/or kV according to patient size and/or use of iterative reconstruction technique. COMPARISON:  05/30/2022 FINDINGS: RIGHT TEMPORAL BONE External auditory canal: Normal. Middle ear cavity: Normally aerated. The scutum and ossicles are normal. The tegmen tympani is intact. Inner ear structures: The cochlea, vestibule and semicircular canals are normal. The vestibular aqueduct is not enlarged. Internal auditory and facial nerve canals:  Normal Mastoid air cells: Normal pneumatization and diffuse aeration LEFT TEMPORAL BONE External auditory canal: Diffuse opacification by soft tissue density. No erosion seen. Middle ear cavity: Complete opacification.  No erosion detected. Inner ear structures: Normal formation and density. Internal auditory and facial nerve canals:  Unremarkable Mastoid air cells: Confluent opacification.  No coalescence/erosion. Vascular: No hyperdense vessel. Limited intracranial: Vague low-density along the inner table at the left temporal bone, see 5: 10 markings. Visible orbits/paranasal sinuses: Negative Soft tissues: No evidence of acute inflammation below the left mastoid. IMPRESSION: Otomastoiditis on the left with diffuse opacification since 05/30/2022.  Vague low-density along the inner table of the left temporal bone, recommend postcontrast CT assessment versus MRI. Electronically Signed   By: Tiburcio Pea M.D.   On: 07/14/2023 05:50    EKG: Ordered in ED but not yet performed.  Assessment/Plan Active Problems:   Otitis   Thrombophlebitis   Dural sinus thrombosis   Mastoiditis   Otitis Thrombophlebitis Mastoiditis Dural sinus thrombosis > As per HPI started with left-sided ear infection in July has progressed per imaging today to otomastoiditis with thrombophlebitis of dural sinus and upper left IJ as well as developing dural sinus thrombosis. > ENT consulted and are following > Started on Unasyn in the ED.  Continues to drain so recommendation was to continue with Unasyn, add Ciprodex drops and an ear wick. - Monitor on telemetry - Appreciate ENT recommendations and assistance - Continue Unasyn - Continue Ciprodex - Continue ear wick - Trend fever curve and WBC - As needed pain control medication - Supportive care - Heparin drip for dural sinus thrombosis  DVT prophylaxis: Heparin Code Status:   Full  Family Communication:  Updated at bedside  Disposition Plan:   Patient is from:  Home  Anticipated DC to:  Home  Anticipated DC date:  1 to 4 days  Anticipated DC barriers: None  Consults called:  ENT Admission status:  Observation, telemetry  Severity of Illness: The appropriate patient status for this patient is OBSERVATION. Observation status is judged to be reasonable and necessary in order to provide the required intensity of service to ensure the patient's safety. The patient's presenting symptoms, physical exam findings, and initial radiographic and laboratory data in the context of their medical condition is felt to place them at decreased risk for further clinical deterioration. Furthermore, it is anticipated that the patient will be medically stable for discharge from the hospital within 2 midnights of admission.     Synetta Fail MD [facility]  How to contact the [facility] Attending or Consulting provider 7A - 7P or covering provider during after hours 7P -7A, for this patient?   Check the care team in [facility] and look for a) attending/consulting [facility] provider listed and b) the [facility] team listed Log into [email or web address] and use [place]'s universal password to access. If you do not have the password, please contact the hospital operator. Locate the [facility] provider you are looking for under [facility] and page to a number that you can be directly reached. If you still have difficulty reaching the provider, please page the [facility] (Director on Call) for the Hospitalists listed on amion for assistance.  07/14/2023, 12:33 PM

## 2023-07-15 ENCOUNTER — Inpatient Hospital Stay (HOSPITAL_COMMUNITY): Payer: Medicaid Other

## 2023-07-15 DIAGNOSIS — H7090 Unspecified mastoiditis, unspecified ear: Secondary | ICD-10-CM | POA: Diagnosis not present

## 2023-07-15 DIAGNOSIS — D696 Thrombocytopenia, unspecified: Secondary | ICD-10-CM | POA: Diagnosis present

## 2023-07-15 DIAGNOSIS — H6692 Otitis media, unspecified, left ear: Secondary | ICD-10-CM | POA: Diagnosis present

## 2023-07-15 DIAGNOSIS — I808 Phlebitis and thrombophlebitis of other sites: Secondary | ICD-10-CM | POA: Diagnosis present

## 2023-07-15 DIAGNOSIS — I82C12 Acute embolism and thrombosis of left internal jugular vein: Secondary | ICD-10-CM | POA: Diagnosis present

## 2023-07-15 DIAGNOSIS — E876 Hypokalemia: Secondary | ICD-10-CM | POA: Diagnosis present

## 2023-07-15 DIAGNOSIS — I809 Phlebitis and thrombophlebitis of unspecified site: Secondary | ICD-10-CM | POA: Diagnosis not present

## 2023-07-15 DIAGNOSIS — H7092 Unspecified mastoiditis, left ear: Secondary | ICD-10-CM | POA: Diagnosis not present

## 2023-07-15 DIAGNOSIS — H70892 Other mastoiditis and related conditions, left ear: Secondary | ICD-10-CM | POA: Diagnosis present

## 2023-07-15 DIAGNOSIS — H7292 Unspecified perforation of tympanic membrane, left ear: Secondary | ICD-10-CM | POA: Diagnosis present

## 2023-07-15 DIAGNOSIS — Z823 Family history of stroke: Secondary | ICD-10-CM | POA: Diagnosis not present

## 2023-07-15 DIAGNOSIS — K5900 Constipation, unspecified: Secondary | ICD-10-CM | POA: Diagnosis present

## 2023-07-15 DIAGNOSIS — G060 Intracranial abscess and granuloma: Secondary | ICD-10-CM | POA: Diagnosis present

## 2023-07-15 DIAGNOSIS — Z885 Allergy status to narcotic agent status: Secondary | ICD-10-CM | POA: Diagnosis not present

## 2023-07-15 DIAGNOSIS — E871 Hypo-osmolality and hyponatremia: Secondary | ICD-10-CM | POA: Diagnosis not present

## 2023-07-15 DIAGNOSIS — D72828 Other elevated white blood cell count: Secondary | ICD-10-CM | POA: Diagnosis present

## 2023-07-15 DIAGNOSIS — H9192 Unspecified hearing loss, left ear: Secondary | ICD-10-CM | POA: Diagnosis present

## 2023-07-15 LAB — COMPREHENSIVE METABOLIC PANEL
ALT: 67 U/L — ABNORMAL HIGH (ref 0–44)
AST: 76 U/L — ABNORMAL HIGH (ref 15–41)
Albumin: 3.5 g/dL (ref 3.5–5.0)
Alkaline Phosphatase: 44 U/L (ref 38–126)
Anion gap: 10 (ref 5–15)
BUN: 13 mg/dL (ref 6–20)
CO2: 24 mmol/L (ref 22–32)
Calcium: 8.5 mg/dL — ABNORMAL LOW (ref 8.9–10.3)
Chloride: 98 mmol/L (ref 98–111)
Creatinine, Ser: 0.88 mg/dL (ref 0.61–1.24)
GFR, Estimated: >60 mL/min (ref >60)
Glucose, Bld: 109 mg/dL — ABNORMAL HIGH (ref 70–99)
Potassium: 3.3 mmol/L — ABNORMAL LOW (ref 3.5–5.1)
Sodium: 132 mmol/L — ABNORMAL LOW (ref 135–145)
Total Bilirubin: 1.8 mg/dL — ABNORMAL HIGH (ref 0.3–1.2)
Total Protein: 5.8 g/dL — ABNORMAL LOW (ref 6.5–8.1)

## 2023-07-15 LAB — CBC
HCT: 38.6 % — ABNORMAL LOW (ref 39.0–52.0)
Hemoglobin: 13 g/dL (ref 13.0–17.0)
MCH: 28.4 pg (ref 26.0–34.0)
MCHC: 33.7 g/dL (ref 30.0–36.0)
MCV: 84.3 fL (ref 80.0–100.0)
Platelets: 134 10*3/uL — ABNORMAL LOW (ref 150–400)
RBC: 4.58 MIL/uL (ref 4.22–5.81)
RDW: 12.5 % (ref 11.5–15.5)
WBC: 11.8 10*3/uL — ABNORMAL HIGH (ref 4.0–10.5)
nRBC: 0 % (ref 0.0–0.2)

## 2023-07-15 LAB — HEPARIN LEVEL (UNFRACTIONATED)
Heparin Unfractionated: 0.28 IU/mL — ABNORMAL LOW (ref 0.30–0.70)
Heparin Unfractionated: 0.31 IU/mL (ref 0.30–0.70)
Heparin Unfractionated: 0.4 IU/mL (ref 0.30–0.70)

## 2023-07-15 MED ORDER — POTASSIUM CHLORIDE CRYS ER 20 MEQ PO TBCR
20.0000 meq | EXTENDED_RELEASE_TABLET | Freq: Once | ORAL | Status: AC
  Administered 2023-07-15: 20 meq via ORAL
  Filled 2023-07-15: qty 1

## 2023-07-15 MED ORDER — PROCHLORPERAZINE EDISYLATE 10 MG/2ML IJ SOLN
10.0000 mg | Freq: Four times a day (QID) | INTRAMUSCULAR | Status: DC | PRN
  Administered 2023-07-15 – 2023-07-18 (×3): 10 mg via INTRAVENOUS
  Filled 2023-07-15 (×3): qty 2

## 2023-07-15 NOTE — Progress Notes (Signed)
ANTICOAGULATION CONSULT NOTE - Follow Up Consult  Pharmacy Consult for heparin  Indication:  venous sinus thrombosis   No Known Allergies  Patient Measurements: Height: 5\' 11"  (180.3 cm) Weight: 86 kg (189 lb 9.5 oz) IBW/kg (Calculated) : 75.3 Heparin Dosing Weight: 86kg   Vital Signs: Temp: 99.3 F (37.4 C) (07/23 0011) Temp Source: Oral (07/22 1946) BP: 133/74 (07/23 0011) Pulse Rate: 64 (07/23 0011)  Labs: Recent Labs    07/14/23 0254 07/14/23 1927 07/15/23 0117  HGB 15.3  --  13.0  HCT 43.7  --  38.6*  PLT 172  --  134*  HEPARINUNFRC  --  0.39 0.28*  CREATININE 0.83  --   --     Estimated Creatinine Clearance: 148.7 mL/min (by C-G formula based on SCr of 0.83 mg/dL).   Medical History: Past Medical History:  Diagnosis Date   Left-sided Bell's palsy     Assessment: Patient admitted with CC of persistent drainage from left ear. Initial imaging concerning for mastoiditis. MRI indicating sinus thrombosis that goes into the internal jugular. Pharmacy consulted to dose heparin.  HgB 14.3 and PLTS 172.   Not on anticoagulation PTA.   First heparin level is therapeutic at 0.39 on heparin 1400 units/hr. No bleeding or issues with the infusion reported.  7/23 AM: Heparin level returned at 0.28 (subtherapeutic) on 1400 units/hr. Per RN no issues with infusion running continuously or signs/symptoms of bleeding. Hgb 15.3 >13, and plts 172>134  Goal of Therapy: Heparin level 0.3-0.7 units/ml Monitor platelets by anticoagulation protocol: Yes   Plan:  Increase heparin infusion at 1500 units/hr Heparin level in 6hrs  Check daily heparin level and CBC Continue to monitor for signs/symptoms of bleeding  Arabella Merles, PharmD. Clinical Pharmacist 07/15/2023 2:46 AM

## 2023-07-15 NOTE — Progress Notes (Signed)
[facility] PROGRESS NOTE  Caleb Mendoza (DOB: 2001/05/20) ZOX:[id number] PCP: Irven Coe, MD  Brief Narrative: Caleb Mendoza is a 22 y.o. male with no chronic medical conditions who presented to the ED on 07/14/2023 with headache, left ear drainage. He was febrile, intermittently tachycardic, with leukocytosis (14.3k). CT of the temporal bone showed otomastoiditis with diffuse opacification and changes at the temporal bone with recommendation for MRI follow-up. Patient transferred from med center to ED for MRI and further workup. MRI showed otomastoiditis with epidural empyema, thrombophlebitis of dural sinus with thrombosis of dural sinus. CTA of the neck showed septic thrombophlebitis extending into the upper left IJ.   ENT was consulted and he was admitted with plans for continued IV unasyn, and systemic anticoagulation.  Subjective: Still has pounding headache. Feels better overall, pain improved with IV medications. Has yet to eat/drink.  Objective: BP 126/76 (BP Location: Left Arm)   Pulse 63   Temp 97.9 F (36.6 C) (Oral)   Resp 18   Ht 5\' 11"  (1.803 m)   Wt 86 kg   SpO2 98%   BMI 26.44 kg/m   Gen: No distress HEENT: Left face/neck diffusely edematous with mild tenderness pulling on pinna and percussing left mastoid, no crepitus. Gauze with some exudate, no bleeding, in external ear.  Pulm: Clear, nonlabored  CV: RRR, no MRG or edema GI: Soft, NT, ND, +BS  Neuro: Alert and oriented. No new focal deficits. Ext: Warm, no deformities. Skin: No other rashes, lesions or ulcers on visualized skin   Assessment & Plan: Otomastoiditis with TM perforation, septic thrombophlebitis of left IJ vein.  - Continue IV unasyn, monitor ability to tolerate oral intake supported with antiemetics, pain control (requiring IV dilaudid).  - Continue ciprodex gtt's - ENT following.   Dural sinus thrombosis:  - Continue IV heparin  Hypokalemia:  - Supplement and monitor  LFT  elevation: Mild increase in AST/ALT, Tbili 1.5 > 1.8 - Monitor CMP in AM, fractionate bilirubin.  - RUQ U/S  Tyrone Nine, MD [facility] [email or web address] 07/15/2023, 9:45 AM

## 2023-07-15 NOTE — Progress Notes (Signed)
ANTICOAGULATION CONSULT NOTE - Follow Up Consult  Pharmacy Consult for Heparin Indication:  venous sinus thrombosis  No Known Allergies  Patient Measurements: Height: 5\' 11"  (180.3 cm) Weight: 86 kg (189 lb 9.5 oz) IBW/kg (Calculated) : 75.3 Heparin Dosing Weight: 86 kg  Vital Signs: Temp: 98.6 F (37 C) (07/23 1146) Temp Source: Oral (07/23 1146) BP: 138/70 (07/23 1146) Pulse Rate: 75 (07/23 1146)  Labs: Recent Labs    07/14/23 0254 07/14/23 1927 07/15/23 0117 07/15/23 1056  HGB 15.3  --  13.0  --   HCT 43.7  --  38.6*  --   PLT 172  --  134*  --   HEPARINUNFRC  --  0.39 0.28* 0.31  CREATININE 0.83  --  0.88  --     Estimated Creatinine Clearance: 140.2 mL/min (by C-G formula based on SCr of 0.88 mg/dL).  Assessment: Patient admitted 07/14/23 with CC of persistent drainage from left ear. Initial imaging concerning for mastoiditis. MRI indicating sinus thrombosis that goes into the internal jugular. Pharmacy consulted to dose heparin.  HgB 14.3 and PLTS 172 on admit.  Not on anticoagulation PTA.   Heparin level is now low therapeutic (0.31) on 1500 units/hr. HgB and PLTs trended down some, no bleeding reported.  Goal of Therapy:  Heparin level 0.3-0.7 units/ml Monitor platelets by anticoagulation protocol: Yes   Plan:  Increase heparin drip to 1600 units/hr to try to keep level in target range. Heparin level ~6 hrs after rate change. Daily heparin level and CBC. Monitor for signs/symptoms of bleeding.  Dennie Fetters, RPh 07/15/2023,12:25 PM

## 2023-07-15 NOTE — Plan of Care (Signed)

## 2023-07-15 NOTE — Progress Notes (Signed)
ANTICOAGULATION CONSULT NOTE - Follow Up Consult  Pharmacy Consult for Heparin Indication:  venous sinus thrombosis  No Known Allergies  Patient Measurements: Height: 5\' 11"  (180.3 cm) Weight: 86 kg (189 lb 9.5 oz) IBW/kg (Calculated) : 75.3 Heparin Dosing Weight: 86 kg  Vital Signs: Temp: 98.7 F (37.1 C) (07/23 1551) Temp Source: Oral (07/23 1551) BP: 134/76 (07/23 1551) Pulse Rate: 63 (07/23 1551)  Labs: Recent Labs    07/14/23 0254 07/14/23 1927 07/15/23 0117 07/15/23 1056 07/15/23 1821  HGB 15.3  --  13.0  --   --   HCT 43.7  --  38.6*  --   --   PLT 172  --  134*  --   --   HEPARINUNFRC  --    < > 0.28* 0.31 0.40  CREATININE 0.83  --  0.88  --   --    < > = values in this interval not displayed.    Estimated Creatinine Clearance: 140.2 mL/min (by C-G formula based on SCr of 0.88 mg/dL).  Assessment: Patient admitted 07/14/23 with CC of persistent drainage from left ear. Initial imaging concerning for mastoiditis. MRI indicating sinus thrombosis that goes into the internal jugular. Pharmacy consulted to dose heparin.  HgB 14.3 and PLTS 172 on admit.  Not on anticoagulation PTA.   Heparin level is therapeutic (0.4) on 1600 units/hr. No bleeding reported.  Goal of Therapy:  Heparin level 0.3-0.7 units/ml Monitor platelets by anticoagulation protocol: Yes   Plan:  Continue heparin at 1600 units/hr  Daily heparin level and CBC Monitor for signs/symptoms of bleeding.  [employer], Pharm.D., BCPS Clinical Pharmacist  **Pharmacist phone directory can be found on [email or web address] listed under [facility].  07/15/2023 7:28 PM

## 2023-07-16 DIAGNOSIS — H7092 Unspecified mastoiditis, left ear: Secondary | ICD-10-CM | POA: Diagnosis not present

## 2023-07-16 LAB — COMPREHENSIVE METABOLIC PANEL
ALT: 95 U/L — ABNORMAL HIGH (ref 0–44)
AST: 80 U/L — ABNORMAL HIGH (ref 15–41)
Albumin: 3.5 g/dL (ref 3.5–5.0)
Alkaline Phosphatase: 41 U/L (ref 38–126)
Anion gap: 8 (ref 5–15)
BUN: 10 mg/dL (ref 6–20)
CO2: 28 mmol/L (ref 22–32)
Calcium: 8.6 mg/dL — ABNORMAL LOW (ref 8.9–10.3)
Chloride: 99 mmol/L (ref 98–111)
Creatinine, Ser: 0.86 mg/dL (ref 0.61–1.24)
GFR, Estimated: >60 mL/min (ref >60)
Glucose, Bld: 96 mg/dL (ref 70–99)
Potassium: 3.6 mmol/L (ref 3.5–5.1)
Sodium: 135 mmol/L (ref 135–145)
Total Bilirubin: 1.4 mg/dL — ABNORMAL HIGH (ref 0.3–1.2)
Total Protein: 5.9 g/dL — ABNORMAL LOW (ref 6.5–8.1)

## 2023-07-16 LAB — CBC
HCT: 39 % (ref 39.0–52.0)
Hemoglobin: 13.4 g/dL (ref 13.0–17.0)
MCH: 29.3 pg (ref 26.0–34.0)
MCHC: 34.4 g/dL (ref 30.0–36.0)
MCV: 85.3 fL (ref 80.0–100.0)
Platelets: 137 10*3/uL — ABNORMAL LOW (ref 150–400)
RBC: 4.57 MIL/uL (ref 4.22–5.81)
RDW: 12.3 % (ref 11.5–15.5)
WBC: 9.5 10*3/uL (ref 4.0–10.5)
nRBC: 0 % (ref 0.0–0.2)

## 2023-07-16 LAB — HEPARIN LEVEL (UNFRACTIONATED): Heparin Unfractionated: 0.35 IU/mL (ref 0.30–0.70)

## 2023-07-16 NOTE — Progress Notes (Signed)
ANTICOAGULATION CONSULT NOTE - Follow Up Consult  Pharmacy Consult for Heparin Indication:  venous sinus thrombosis  No Known Allergies  Patient Measurements: Height: 5\' 11"  (180.3 cm) Weight: 82.9 kg (182 lb 12.2 oz) IBW/kg (Calculated) : 75.3 Heparin Dosing Weight: 86 kg  Vital Signs: Temp: 97.7 F (36.5 C) (07/24 0747) Temp Source: Oral (07/24 0747) BP: 113/80 (07/24 0747) Pulse Rate: 53 (07/24 0747)  Labs: Recent Labs    07/14/23 0254 07/14/23 1927 07/15/23 0117 07/15/23 1056 07/15/23 1821 07/16/23 0033  HGB 15.3  --  13.0  --   --  13.4  HCT 43.7  --  38.6*  --   --  39.0  PLT 172  --  134*  --   --  137*  HEPARINUNFRC  --    < > 0.28* 0.31 0.40 0.35  CREATININE 0.83  --  0.88  --   --  0.86   < > = values in this interval not displayed.    Estimated Creatinine Clearance: 143.5 mL/min (by C-G formula based on SCr of 0.86 mg/dL).  Assessment: Patient admitted 07/14/23 with CC of persistent drainage from left ear. Initial imaging concerning for mastoiditis. MRI indicating sinus thrombosis that goes into the internal jugular. Pharmacy consulted to dose heparin.  HgB 14.3 and PLTS 172 on admit.  Not on anticoagulation PTA.   Heparin level is therapeutic (0.35) on 1600 units/hr.  Hgb 13.4, plt 137, stable No issues with infusion or signs/symptoms of bleeding per RN.  Goal of Therapy:  Heparin level 0.3-0.7 units/ml Monitor platelets by anticoagulation protocol: Yes   Plan:  Continue heparin at 1600 units/hr  Daily heparin level and CBC Monitor for signs/symptoms of bleeding.  Stephenie Acres, PharmD PGY1 Pharmacy Resident 07/16/2023 9:00 AM

## 2023-07-16 NOTE — Progress Notes (Signed)
PROGRESS NOTE  Caleb Mendoza  VZD:[id number] DOB: June 16, 2001 DOA: 07/14/2023 PCP: Irven Coe, MD   Brief Narrative: Patient is a 22 year old male with no significant past medical history who presented with headache, left ear drainage, fever.  On presentation he was tachycardic, had leukocytosis as per labs.  CT of the temporal bone showed otomastoiditis with diffuse opacification.  MRI confirmed otomastoiditis with epidural empyema, thrombophlebitis / thrombosis of dural sinus.  CTA of the neck showed septic thrombophlebitis extending to the upper left IJ.  ENT consulted.  Started on heparin drip.  Currently on broad spectrum for antibiotics.  Discharge planning after clearance from ENT  Assessment & Plan:  Principal Problem:   Mastoiditis Active Problems:   Otitis   Thrombophlebitis   Dural sinus thrombosis   Mastoiditis, left  Otomastoiditis /otitis media with tympanic membrane perforation, septic thrombophlebitis of left IJ vein: On Unasyn.  Continue pain management, supportive care.  On Ciprodex GTTs.  ENT following without plan for operative intervention as pus is  draining.  Management as per ENT.  Leukocytosis has resolved. Patient will ultimately require ENT follow-up for tympanic membrane perforation and formal audiogram.  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  Right upper quadrant ultrasound did not show any acute findings. Has mild thrombocytopenia         DVT prophylaxis:iv heparin     Code Status: Full Code  Family Communication: Discussed with mother at bedside  Patient status:Inpatient  Patient is from :home  Anticipated discharge IR:JJOA  Estimated DC date:after ENT clearance   Consultants: ENT  Procedures:None  Antimicrobials:  Anti-infectives (From admission, onward)    Start     Dose/Rate Route Frequency Ordered Stop   07/14/23 1245  Ampicillin-Sulbactam  (UNASYN) 3 g in sodium chloride 0.9 % 100 mL IVPB        3 g 200 mL/hr over 30 Minutes Intravenous Every 6 hours 07/14/23 1237     07/14/23 0630  Ampicillin-Sulbactam (UNASYN) 3 g in sodium chloride 0.9 % 100 mL IVPB        3 g 200 mL/hr over 30 Minutes Intravenous  Once 07/14/23 4166 07/14/23 0630       Subjective: Patient seen and examined at bedside today.  Hemodynamically stable.  Comfortable.  No fever.  Left ear packed with gauze.  Complains of some headache.  No nausea or vomiting.  Objective: Vitals:   07/15/23 2042 07/16/23 0038 07/16/23 0600 07/16/23 0747  BP: 127/75 124/73  113/80  Pulse: 64 (!) 49  (!) 53  Resp: 18 18  16   Temp: 98.7 F (37.1 C) 97.9 F (36.6 C)  97.7 F (36.5 C)  TempSrc:    Oral  SpO2: 100% 98%  99%  Weight:   82.9 kg   Height:       No intake or output data in the 24 hours ending 07/16/23 0952 Filed Weights   07/14/23 1210 07/16/23 0600  Weight: 86 kg 82.9 kg    Examination:  General exam: Overall comfortable, not in distress HEENT: PERRL, gauze on left ear Respiratory system:  no wheezes or crackles  Cardiovascular system: S1 & S2 heard, RRR.  Gastrointestinal system: Abdomen is nondistended, soft and nontender. Central nervous system: Alert and oriented Extremities: No edema, no clubbing ,no cyanosis Skin: No rashes, no ulcers,no icterus     Data Reviewed: I have personally reviewed following labs and imaging studies  CBC: Recent Labs  Lab 07/14/23 0254 07/15/23 0117 07/16/23 0033  WBC 14.3* 11.8* 9.5  HGB 15.3 13.0 13.4  HCT 43.7 38.6* 39.0  MCV 83.1 84.3 85.3  PLT 172 134* 137*   Basic Metabolic Panel: Recent Labs  Lab 07/14/23 0254 07/15/23 0117 07/16/23 0033  NA 137 132* 135  K 3.5 3.3* 3.6  CL 99 98 99  CO2 24 24 28   GLUCOSE 115* 109* 96  BUN 12 13 10   CREATININE 0.83 0.88 0.86  CALCIUM 9.9 8.5* 8.6*     No results found for this or any previous visit (from the past 240 hour(s)).   Radiology  Studies: US Abdomen Limited RUQ (LIVER/GB)  Result Date: 07/15/2023 CLINICAL DATA:  Elevated LFTs EXAM: ULTRASOUND ABDOMEN LIMITED RIGHT UPPER QUADRANT COMPARISON:  None Available. FINDINGS: Gallbladder: No gallstones or wall thickening visualized. No sonographic Murphy sign noted by sonographer. Common bile duct: Diameter: 2.8 mm. Liver: No focal lesion identified. Within normal limits in parenchymal echogenicity. Portal vein is patent on color Doppler imaging with normal direction of blood flow towards the liver. Other: None. IMPRESSION: No acute abnormality noted. Electronically Signed   By: Alcide Clever M.D.   On: 07/15/2023 23:18   CT Angio Neck W and/or Wo Contrast  Result Date: 07/14/2023 CLINICAL DATA:  Venous thrombosis.  Septic thrombophlebitis. EXAM: CT ANGIOGRAPHY NECK TECHNIQUE: Multidetector CT imaging of the neck was performed using the standard protocol during bolus administration of intravenous contrast. Multiplanar CT image reconstructions and MIPs were obtained to evaluate the vascular anatomy. Carotid stenosis measurements (when applicable) are obtained utilizing NASCET criteria, using the distal internal carotid diameter as the denominator. RADIATION DOSE REDUCTION: This exam was performed according to the departmental dose-optimization program which includes automated exposure control, adjustment of the mA and/or kV according to patient size and/or use of iterative reconstruction technique. CONTRAST:  75mL OMNIPAQUE IOHEXOL 350 MG/ML SOLN COMPARISON:  CT of the temporal bones and brain MRI from earlier the same day FINDINGS: Aortic arch: Normal Right carotid system: Widely patent and smoothly contoured. Left carotid system: Widely patent and smoothly contoured. Vertebral arteries: Widely patent and smoothly contoured. Skeleton: Otomastoiditis on the left, known. Other neck: The patient's left IJ thrombus visibly terminates just below the jugular foramen. No inflammation or venous  thrombosis seen in the neck. Upper chest: Clear. Intracranial findings as described on recent brain MRI. IMPRESSION: Septic thrombophlebitis affects only a short segment of the left IJ. No inflammation or collection seen in the neck. Intracranial findings as described on preceding brain MRI. Electronically Signed   By: Tiburcio Pea M.D.   On: 07/14/2023 11:13   MR Brain W and Wo Contrast  Result Date: 07/14/2023 CLINICAL DATA:  Concern for otomastoiditis.  Abnormal head CT. EXAM: MRI HEAD WITHOUT AND WITH CONTRAST TECHNIQUE: Multiplanar, multiecho pulse sequences of the brain and surrounding structures were obtained without and with intravenous contrast. CONTRAST:  10mL GADAVIST GADOBUTROL 1 MMOL/ML IV SOLN COMPARISON:  Temporal bone CT from earlier today FINDINGS: Brain: Epidural empyema along the left mastoid which restricts diffusion and uplifts the thrombosed transverse sigmoid junction with clot continuing into the upper left internal jugular vein. Abscess along the posteromedial mastoid measures 2.3 x 0.6 x 2.4 cm. Contiguous component which tracks superiorly along the posterior aspect of the temporal bone. Mild mass effect on the left lateral cerebellum without complicating infarct. No hydrocephalus. No generalized subarachnoid spread of infection seen. 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 Skull and upper cervical spine:  Negative Sinuses/Orbits: As above.  Clear sinuses and negative orbits. Other: These results were called by telephone at the time of interpretation on 07/14/2023 at 10:36 am to provider [facility] , who verbally acknowledged these results. IMPRESSION: Complicated otomastoiditis with epidural empyema uplifting the thrombosed transverse sigmoid dural sinus with continuation into the left upper IJ. Electronically Signed   By: Tiburcio Pea M.D.   On: 07/14/2023 10:42    Scheduled Meds:  ciprofloxacin-dexamethasone  4 drop Left EAR BID   sodium chloride  flush  3 mL Intravenous Q12H   Continuous Infusions:  ampicillin-sulbactam (UNASYN) IV Stopped (07/16/23 9604)   heparin 1,600 Units/hr (07/15/23 2127)     LOS: 1 day   Burnadette Pop, MD [facility] P7/24/2024, 9:52 AM

## 2023-07-16 NOTE — Progress Notes (Signed)
Transition of Care [facility]) - Inpatient Brief Assessment   Patient Details  Name: Caleb Mendoza MRN: [id number] Date of Birth: 07/27/2001  Transition of [facility]) CM/SW Contact:    Janae Bridgeman, RN Phone Number: 07/16/2023, 10:06 AM   Clinical Narrative: Patient admitted with Mastitis.  Patient's clinicals screened and no TOC needs at this time.  Please reach out for [facility] consult if needed.   Transition of Care Asessment: Insurance and Status: (P) Insurance coverage has been reviewed Patient has primary care physician: (P) Yes Home environment has been reviewed: (P) Yes Prior level of function:: (P) Independent Prior/Current Home Services: (P) No current home services Social Determinants of Health Reivew: (P) SDOH reviewed no interventions necessary Readmission risk has been reviewed: (P) Yes Transition of care needs: (P) no transition of care needs at this time

## 2023-07-16 NOTE — Progress Notes (Addendum)
ENT PROGRESS NOTE   Subjective: Patient seen and examined at bedside. Patient reports significant improvement in left canal edema and states that the ear wick fell out yesterday. He continues to experience purulent otorrhea from the left ear, as well as as headaches and neck pain.   Objective: Vital signs in last 24 hours: Temp:  [97.7 F (36.5 C)-98.7 F (37.1 C)] 97.7 F (36.5 C) (07/24 0747) Pulse Rate:  [49-64] 53 (07/24 0747) Resp:  [16-18] 16 (07/24 0747) BP: (113-127)/(73-80) 113/80 (07/24 0747) SpO2:  [98 %-100 %] 99 % (07/24 0747) Weight:  [82.9 kg] 82.9 kg (07/24 0600)  CONSTITUTIONAL: well developed, nourished, no distress and alert and oriented x 3 PULMONARY/CHEST WALL: effort normal and no stridor, no stertor, no dysphonia HENT: Head : normocephalic and atraumatic Ears: Right ear:   canal normal, external ear normal and hearing normal. TM intact, normal. Left ear:   Purulent drainage from ear canal suctioned. Canal edema has resolved. Persistent posterior 50% perforation of tympanic membrane with purulent otorrhea, no erythema or edema over mastoid. Nose: nose normal and no purulence Mouth/Throat:  Mouth: uvula midline and no oral lesions Throat: oropharynx clear and moist Mucous membranes: normal EYES: conjunctiva normal, EOM normal and PERRL NECK: Tender to palpation over left neck, neck edema appears improved   @LABLAST2 (wbc:2,hgb:2,hct:2,plt:2) Recent Labs    07/15/23 0117 07/16/23 0033  NA 132* 135  K 3.3* 3.6  CL 98 99  CO2 24 28  GLUCOSE 109* 96  BUN 13 10  CREATININE 0.88 0.86  CALCIUM 8.5* 8.6*    Medications: I have reviewed the patient's current medications.  New Imaging: None   Assessment/Plan: Caleb Mendoza is a 22 y.o M with left acute otitis media complicated by spontaneous tympanic membrane perforation and thrombophlebitis of dural sinus and left upper internal jugular vein.  Despite complete opacification of the mastoid air cells and  middle ear space, there is no evidence of mastoid air cell coalescence or physical exam findings to indicate acute mastoiditis.    Patient's canal edema/otitis externa has resolved, no need for wick replacement at this time. Persistent perforation of left tympanic membrane with purulent otorrhea. Patient is still tender to palpation over the left neck and reports pain with ROM. No tenderness or erythema or edema over mastoid.  Advised continued application of ciprodex, 4gtt BID to the left ear for 12 more days. Leukocytosis has resolved; may be helpful to have ID input regarding regimen/timing of IV to oral transition in light of IJV thrombophlebitis. Recommend repeat imaging to ensure improvement of intracranial process. If no improvement, recommend consultation with Neurosurgery.    LOS: 1 day    Laren Boom, DO [facility] ENT 07/16/2023, 4:22 PM

## 2023-07-17 ENCOUNTER — Inpatient Hospital Stay (HOSPITAL_COMMUNITY): Payer: Medicaid Other

## 2023-07-17 DIAGNOSIS — H6692 Otitis media, unspecified, left ear: Secondary | ICD-10-CM | POA: Diagnosis not present

## 2023-07-17 DIAGNOSIS — H7092 Unspecified mastoiditis, left ear: Secondary | ICD-10-CM | POA: Diagnosis not present

## 2023-07-17 LAB — CBC
HCT: 37.6 % — ABNORMAL LOW (ref 39.0–52.0)
Hemoglobin: 13 g/dL (ref 13.0–17.0)
MCH: 28.3 pg (ref 26.0–34.0)
MCHC: 34.6 g/dL (ref 30.0–36.0)
MCV: 81.9 fL (ref 80.0–100.0)
Platelets: 142 10*3/uL — ABNORMAL LOW (ref 150–400)
RDW: 12.5 % (ref 11.5–15.5)
nRBC: 0 % (ref 0.0–0.2)

## 2023-07-17 LAB — HEPARIN LEVEL (UNFRACTIONATED): Heparin Unfractionated: 0.3 IU/mL (ref 0.30–0.70)

## 2023-07-17 MED ORDER — LORAZEPAM 2 MG/ML IJ SOLN: 1.0000 mg | Freq: Once | INTRAMUSCULAR | Status: DC

## 2023-07-17 MED ORDER — PANTOPRAZOLE SODIUM 40 MG PO TBEC
40.0000 mg | DELAYED_RELEASE_TABLET | Freq: Every day | ORAL | Status: DC
  Administered 2023-07-17 – 2023-07-19 (×3): 40 mg via ORAL
  Filled 2023-07-17 (×3): qty 1

## 2023-07-17 MED ORDER — KETOROLAC TROMETHAMINE 15 MG/ML IJ SOLN
15.0000 mg | Freq: Four times a day (QID) | INTRAMUSCULAR | Status: DC | PRN
  Administered 2023-07-18: 15 mg via INTRAVENOUS
  Filled 2023-07-17: qty 1

## 2023-07-17 MED ORDER — OXYCODONE-ACETAMINOPHEN 5-325 MG PO TABS
1.0000 | ORAL_TABLET | Freq: Four times a day (QID) | ORAL | Status: DC | PRN
  Administered 2023-07-18: 1 via ORAL
  Filled 2023-07-17: qty 1

## 2023-07-17 NOTE — Consult Note (Signed)
    [facility]       Reason for Consult: left acute otitis media with TM perforation and dural and internal jugular thrombophlebitis    Referring Physician: Dr. Renford Dills  Principal Problem:   Mastoiditis Active Problems:   Otitis   Thrombophlebitis   Dural sinus thrombosis   Mastoiditis, left    ciprofloxacin-dexamethasone  4 drop Left EAR BID   LORazepam  1 mg Intravenous Once   pantoprazole  40 mg Oral Daily   sodium chloride flush  3 mL Intravenous Q12H    Recommendations: Pending MRI, will consider Augmentin 3 weeks at discharge Continue with ampicillin/sulbactam while inpatient He has follow up with me 8/15 at 4pm.  I told him he can cancel if he is feeling well.    Assessment: He has purulent otitis media with perforation and thrombophlebitis.  No cultures to guide treatment but he has improved with current antibiotics.  MRI ordered to recheck   HPI: Caleb Mendoza is a 22 y.o. male with no significant medical history developed an ear infection while out of town and on amoxicillin, developed a ruptured TM and purulent drainage and came to the ED with a new headache.  Found to have above and a dural and short internal jugular sinus thrombosis.  He was admitted and started on ampicillin/sulbactam.  No evidence of mastoid air cell coalescence.  + leukocytosis, + fever, now afebrile > 48 hours and WBC now wnl.    Review of Systems:  Constitutional: negative for fevers and chills All other systems reviewed and are negative    Past Medical History:  Diagnosis Date   Left-sided Bell's palsy     Social History   Tobacco Use   Smoking status: Never  Substance Use Topics   Alcohol use: No   Drug use: Yes    Frequency: 1.0 times per week    Types: Marijuana    Family History  Problem Relation Age of Onset   Stroke Paternal Grandfather     No Known Allergies  Physical Exam: Constitutional: in no apparent distress  Vitals:    07/17/23 0532 07/17/23 0729  BP: 133/88 124/82  Pulse: (!) 51 62  Resp: 18   Temp: 98.2 F (36.8 C) 98.1 F (36.7 C)  SpO2: 99% 100%   EYES: anicteric ENMT: mild left neck swelling, minimal tenderness.  Ear packed Respiratory: normal respiratory effort Musculoskeletal:no edema  Lab Results  Component Value Date   WBC 6.9 07/17/2023   HGB 13.0 07/17/2023   HCT 37.6 (L) 07/17/2023   MCV 81.9 07/17/2023   PLT 142 (L) 07/17/2023    Lab Results  Component Value Date   CREATININE 0.86 07/16/2023   BUN 10 07/16/2023   NA 135 07/16/2023   K 3.6 07/16/2023   CL 99 07/16/2023   CO2 28 07/16/2023    Lab Results  Component Value Date   ALT 95 (H) 07/16/2023   AST 80 (H) 07/16/2023   ALKPHOS 41 07/16/2023     Microbiology: No results found for this or any previous visit (from the past 240 hour(s)).  Gardiner Barefoot, MD [facility] for Infectious Disease [email or web address] 07/17/2023, 3:31 PM

## 2023-07-17 NOTE — Progress Notes (Signed)
PROGRESS NOTE  CHRISTERPHER CLOS  ZOX:[id number] DOB: 11/15/2001 DOA: 07/14/2023 PCP: Irven Coe, MD   Brief Narrative: Patient is a 22 year old male with no significant past medical history who presented with headache, left ear drainage, fever.  On presentation he was tachycardic, had leukocytosis as per labs.  CT of the temporal bone showed otomastoiditis with diffuse opacification.  MRI confirmed otomastoiditis with epidural empyema, thrombophlebitis / thrombosis of dural sinus.  CTA of the neck showed septic thrombophlebitis extending to the upper left IJ.  ENT consulted.  Started on heparin drip.  Currently on broad spectrum for antibiotics.  Discharge planning after clearance from ENT.  Plan for follow-up MRI today, ID consulted for assistance with antibiotics  Assessment & Plan:  Principal Problem:   Mastoiditis Active Problems:   Otitis   Thrombophlebitis   Dural sinus thrombosis   Mastoiditis, left  Otomastoiditis /otitis media with tympanic membrane perforation, septic thrombophlebitis of left IJ vein: On Unasyn.  Continue pain management, supportive care.  On Ciprodex GTTs, ENT recommending to continue for 2 weeks.  ENT following without plan for operative intervention as pus is  draining.  Management as per ENT.  Leukocytosis has resolved. Patient will ultimately require ENT follow-up for tympanic membrane perforation and formal audiogram. As per ENT recommendation, checking MRI of the brain with and without contrast.  Also requested ID consultation for guidance on antibiotics.  He is afebrile. If follow-up MRI shows worsening or no improvement, will contemplate neurosurgery consultation.  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  Most likely he will be continued on Eliquis for 3 months  Hypokalemia: Being monitored and supplemented as needed  Elevated LFTs: Stable.  Right upper quadrant ultrasound did not show any acute findings. Has mild thrombocytopenia         DVT  prophylaxis:iv heparin     Code Status: Full Code  Family Communication: Discussed with mother at bedside  Patient status:Inpatient  Patient is from :home  Anticipated discharge WJ:XBJY  Estimated DC date:after ENT clearance   Consultants: ENT, ID  Procedures:None  Antimicrobials:  Anti-infectives (From admission, onward)    Start     Dose/Rate Route Frequency Ordered Stop   07/14/23 1245  Ampicillin-Sulbactam (UNASYN) 3 g in sodium chloride 0.9 % 100 mL IVPB        3 g 200 mL/hr over 30 Minutes Intravenous Every 6 hours 07/14/23 1237     07/14/23 0630  Ampicillin-Sulbactam (UNASYN) 3 g in sodium chloride 0.9 % 100 mL IVPB        3 g 200 mL/hr over 30 Minutes Intravenous  Once 07/14/23 7829 07/14/23 5621       Subjective: Patient seen and examined at bedside today.  Hemodynamically stable.  afebrile.  Still has some discomfort on the left neck and has headache.  No nausea or vomiting.    Objective: Vitals:   07/16/23 0747 07/16/23 2000 07/17/23 0532 07/17/23 0729  BP: 113/80 131/85 133/88 124/82  Pulse: (!) 53 68 (!) 51 62  Resp: 16 15 18    Temp: 97.7 F (36.5 C) (!) 97.4 F (36.3 C) 98.2 F (36.8 C) 98.1 F (36.7 C)  TempSrc: Oral Oral Oral   SpO2: 99% 100% 99% 100%  Weight:      Height:        Intake/Output Summary (Last 24 hours) at 07/17/2023 1116 Last data filed at 07/16/2023 2334 Gross per 24 hour  Intake 678.35 ml  Output --  Net 678.35 ml   [employer]  07/14/23 1210 07/16/23 0600  Weight: 86 kg 82.9 kg    Examination:  General exam: Overall comfortable, not in distress HEENT: PERRL, left ear external auditory canal packed with gauze Respiratory system:  no wheezes or crackles  Cardiovascular system: S1 & S2 heard, RRR.  Gastrointestinal system: Abdomen is nondistended, soft and nontender. Central nervous system: Alert and oriented Extremities: No edema, no clubbing ,no cyanosis Skin: No rashes, no ulcers,no icterus     Data  Reviewed: I have personally reviewed following labs and imaging studies  CBC: Recent Labs  Lab 07/14/23 0254 07/15/23 0117 07/16/23 0033 07/17/23 0135  WBC 14.3* 11.8* 9.5 6.9  HGB 15.3 13.0 13.4 13.0  HCT 43.7 38.6* 39.0 37.6*  MCV 83.1 84.3 85.3 81.9  PLT 172 134* 137* 142*   Basic Metabolic Panel: Recent Labs  Lab 07/14/23 0254 07/15/23 0117 07/16/23 0033  NA 137 132* 135  K 3.5 3.3* 3.6  CL 99 98 99  CO2 24 24 28   GLUCOSE 115* 109* 96  BUN 12 13 10   CREATININE 0.83 0.88 0.86  CALCIUM 9.9 8.5* 8.6*     No results found for this or any previous visit (from the past 240 hour(s)).   Radiology Studies: US Abdomen Limited RUQ (LIVER/GB)  Result Date: 07/15/2023 CLINICAL DATA:  Elevated LFTs EXAM: ULTRASOUND ABDOMEN LIMITED RIGHT UPPER QUADRANT COMPARISON:  None Available. FINDINGS: Gallbladder: No gallstones or wall thickening visualized. No sonographic Murphy sign noted by sonographer. Common bile duct: Diameter: 2.8 mm. Liver: No focal lesion identified. Within normal limits in parenchymal echogenicity. Portal vein is patent on color Doppler imaging with normal direction of blood flow towards the liver. Other: None. IMPRESSION: No acute abnormality noted. Electronically Signed   By: Alcide Clever M.D.   On: 07/15/2023 23:18    Scheduled Meds:  ciprofloxacin-dexamethasone  4 drop Left EAR BID   pantoprazole  40 mg Oral Daily   sodium chloride flush  3 mL Intravenous Q12H   Continuous Infusions:  ampicillin-sulbactam (UNASYN) IV 3 g (07/17/23 0544)   heparin 1,600 Units/hr (07/17/23 0328)     LOS: 2 days   Burnadette Pop, MD [facility] P7/25/2024, 11:16 AM

## 2023-07-17 NOTE — Progress Notes (Signed)
ANTICOAGULATION CONSULT NOTE - Follow Up Consult  Pharmacy Consult for Heparin Indication:  venous sinus thrombosis  No Known Allergies  Patient Measurements: Height: 5\' 11"  (180.3 cm) Weight: 82.9 kg (182 lb 12.2 oz) IBW/kg (Calculated) : 75.3 Heparin Dosing Weight: 86 kg  Vital Signs: Temp: 98.1 F (36.7 C) (07/25 0729) Temp Source: Oral (07/25 0532) BP: 124/82 (07/25 0729) Pulse Rate: 62 (07/25 0729)  Labs: Recent Labs    07/15/23 0117 07/15/23 1056 07/15/23 1821 07/16/23 0033 07/17/23 0135  HGB 13.0  --   --  13.4 13.0  HCT 38.6*  --   --  39.0 37.6*  PLT 134*  --   --  137* 142*  HEPARINUNFRC 0.28*   < > 0.40 0.35 0.30  CREATININE 0.88  --   --  0.86  --    < > = values in this interval not displayed.    Estimated Creatinine Clearance: 143.5 mL/min (by C-G formula based on SCr of 0.86 mg/dL).  Assessment: Patient admitted 07/14/23 with CC of persistent drainage from left ear. Initial imaging concerning for mastoiditis. MRI indicating sinus thrombosis that goes into the internal jugular. Pharmacy consulted to dose heparin.  HgB 14.3 and PLTS 172 on admit.  Not on anticoagulation PTA.   Heparin level is on the low end of therapeutic (0.30) on 1600 units/hr.  Hgb 13.0, plt 142, stable  Goal of Therapy:  Heparin level 0.3-0.7 units/ml Monitor platelets by anticoagulation protocol: Yes   Plan:  Increase heparin slightly to 1650 units/hr  Daily heparin level and CBC Monitor for signs/symptoms of bleeding.  Rennis Petty, PharmD 07/17/2023 9:21 AM

## 2023-07-17 NOTE — Plan of Care (Signed)

## 2023-07-17 NOTE — TOC Progression Note (Signed)
Transition of Care [facility]) - Progression Note    Patient Details  Name: Caleb Mendoza MRN: [id number] Date of Birth: 04-Aug-2001  Transition of Care [facility]) CM/SW Contact  Janae Bridgeman, RN Phone Number: 07/17/2023, 1:43 PM  Clinical Narrative:    CM met with  patient and grandmother at the bedside.  The patient states that his work was filling out short term disability paperwork at this time.  I asked that the patient ask the HR representative place the PCP as the following physician for short term disability follow up.  The patient's grandmother is present at the bedside today.        Expected Discharge Plan and Services                                               Social Determinants of Health (SDOH) Interventions SDOH Screenings   Food Insecurity: No Food Insecurity (07/14/2023)  Housing: Low Risk  (07/14/2023)  Transportation Needs: No Transportation Needs (07/14/2023)  Utilities: Not At Risk (07/14/2023)  Financial Resource Strain: Medium Risk (06/02/2022)   Received from [facility], [facility]  Physical Activity: Unknown (06/02/2022)   Received from [facility], [facility]  Social Connections: Unknown (06/08/2023)   Received from [facility]  Stress: No Stress Concern Present (06/02/2022)   Received from [facility], [facility]  Tobacco Use: Unknown (07/14/2023)    Readmission Risk Interventions    07/16/2023   10:05 AM  Readmission Risk Prevention Plan  Post Dischage Appt Complete  Medication Screening Complete  Transportation Screening Complete

## 2023-07-18 DIAGNOSIS — I809 Phlebitis and thrombophlebitis of unspecified site: Secondary | ICD-10-CM | POA: Diagnosis not present

## 2023-07-18 DIAGNOSIS — H6692 Otitis media, unspecified, left ear: Secondary | ICD-10-CM | POA: Diagnosis not present

## 2023-07-18 DIAGNOSIS — H7092 Unspecified mastoiditis, left ear: Secondary | ICD-10-CM | POA: Diagnosis not present

## 2023-07-18 LAB — CBC: Platelets: 160 10*3/uL (ref 150–400)

## 2023-07-18 MED ORDER — LEVETIRACETAM 500 MG PO TABS
500.0000 mg | ORAL_TABLET | Freq: Two times a day (BID) | ORAL | Status: DC
  Administered 2023-07-18 – 2023-07-19 (×3): 500 mg via ORAL
  Filled 2023-07-18 (×3): qty 1

## 2023-07-18 MED ORDER — GADOBUTROL 1 MMOL/ML IV SOLN
8.0000 mL | Freq: Once | INTRAVENOUS | Status: AC | PRN
  Administered 2023-07-18: 8 mL via INTRAVENOUS

## 2023-07-18 MED ORDER — OXYCODONE HCL 5 MG PO TABS
5.0000 mg | ORAL_TABLET | ORAL | Status: DC | PRN
  Administered 2023-07-18: 5 mg via ORAL
  Filled 2023-07-18: qty 1

## 2023-07-18 MED ORDER — ALPRAZOLAM 0.5 MG PO TABS
0.5000 mg | ORAL_TABLET | Freq: Once | ORAL | Status: AC
  Administered 2023-07-18: 0.5 mg via ORAL
  Filled 2023-07-18: qty 1

## 2023-07-18 MED ORDER — GLYCERIN (LAXATIVE) 2 G RE SUPP
1.0000 | Freq: Two times a day (BID) | RECTAL | Status: DC
  Filled 2023-07-18 (×3): qty 1

## 2023-07-18 MED ORDER — LACTULOSE 10 GM/15ML PO SOLN
10.0000 g | Freq: Every day | ORAL | Status: DC
  Administered 2023-07-18: 10 g via ORAL
  Filled 2023-07-18 (×2): qty 15

## 2023-07-18 NOTE — Progress Notes (Signed)
Pt c/o feeling anxious, restless, and itchy after receiving PO percocet. This was pt first time taking this medicine. MD Adhikari notified.

## 2023-07-18 NOTE — Progress Notes (Signed)
ENT PROGRESS NOTE   Subjective: Patient seen and examined at bedside. Patient reports improvement in headache. States the drainage from his ear has become less purulent and more bloody. No new symptoms. Still having some neck pain.   Objective: Vital signs in last 24 hours: Temp:  [97.8 F (36.6 C)-98.4 F (36.9 C)] 98.1 F (36.7 C) (07/26 1701) Pulse Rate:  [52-81] 64 (07/26 1701) BP: (119-134)/(76-98) 120/76 (07/26 1701) SpO2:  [99 %-100 %] 100 % (07/26 1701)  CONSTITUTIONAL: well developed, nourished, no distress and alert and oriented x 3 PULMONARY/CHEST WALL: effort normal and no stridor, no stertor, no dysphonia HENT: Head : normocephalic and atraumatic Ears: Right ear:   canal normal, external ear normal and hearing normal. TM intact, normal. Left ear:   Bloody drainage from ear canal suctioned, no evidence of purulence today. No canal edema. Persistent posterior 40% perforation of tympanic membrane with bloody otorrhea, no erythema or edema over mastoid. Nose: nose normal and no purulence Mouth/Throat:  Mouth: uvula midline and no oral lesions Throat: oropharynx clear and moist Mucous membranes: normal EYES: conjunctiva normal, EOM normal and PERRL NECK: Tender to palpation over left neck, neck edema appears improved   Lab Results  Component Value Date   WBC 6.7 07/18/2023   HGB 13.6 07/18/2023   HCT 38.9 (L) 07/18/2023   MCV 82.4 07/18/2023   PLT 160 07/18/2023    Recent Labs    07/16/23 0033  NA 135  K 3.6  CL 99  CO2 28  GLUCOSE 96  BUN 10  CREATININE 0.86  CALCIUM 8.6*    Medications: I have reviewed the patient's current medications.  New Imaging: None   Assessment/Plan: Caleb Mendoza is a 22 y.o M with left acute otitis media complicated by spontaneous tympanic membrane perforation and thrombophlebitis of dural sinus and left upper internal jugular vein.  Despite complete opacification of the mastoid air cells and middle ear space, there is no  evidence of mastoid air cell coalescence or physical exam findings to indicate acute mastoiditis.    On exam today, there is no persistent purulence in the left ear canal.  Drainage has become bloody, and this was suctioned to allow visualization of the tympanic membrane.  There is a persistent 40% posterior perforation, which is actively draining bloody drainage.  There is no tenderness to palpation over the mastoid, or erythema.  Repeat imaging today demonstrates persistent intracranial process.  Neurosurgery has been consulted, awaiting recommendations from Dr. Franky Macho.  Discussed with patient that as long as the ear continues to drain, there is no need for surgical intervention from an ENT perspective.  Patient is scheduled to follow-up with me in clinic next Thursday, pending discharge.  Counseled him that if ear perforation heals, a tympanostomy tube may need to be placed in the office.  All questions were answered.  Recommend continuation of Ciprodex drops at discharge, for an additional 10 days.  Oral antibiotics as per infectious disease, appreciate recommendations. Anticoagulation as per primary.   ENT will follow peripherally at this time, please notify on call ENT with any questions or concerns.    LOS: 3 days    Laren Boom, DO [facility] 07/18/2023, 5:33 PM

## 2023-07-18 NOTE — Plan of Care (Signed)
A/ox4 and on room air. Q3 IV dilaudid given several times this shift. Patient had small BM overnight but still felt constipated. Lactulose and suppository ordered for this morning. Repeat MRI head done. Remains on heparin gtt. Vitals stable. Up with self care.    Problem: Education: Goal: Knowledge of General Education information will improve Description: Including pain rating scale, medication(s)/side effects and non-pharmacologic comfort measures Outcome: Progressing   Problem: Health Behavior/Discharge Planning: Goal: Ability to manage health-related needs will improve Outcome: Progressing   Problem: Clinical Measurements: Goal: Ability to maintain clinical measurements within normal limits will improve Outcome: Progressing Goal: Will remain free from infection Outcome: Progressing Goal: Diagnostic test results will improve Outcome: Progressing Goal: Respiratory complications will improve Outcome: Progressing Goal: Cardiovascular complication will be avoided Outcome: Progressing   Problem: Activity: Goal: Risk for activity intolerance will decrease Outcome: Progressing   Problem: Nutrition: Goal: Adequate nutrition will be maintained Outcome: Progressing   Problem: Coping: Goal: Level of anxiety will decrease Outcome: Progressing   Problem: Elimination: Goal: Will not experience complications related to bowel motility Outcome: Progressing Goal: Will not experience complications related to urinary retention Outcome: Progressing   Problem: Pain Managment: Goal: General experience of comfort will improve Outcome: Progressing   Problem: Safety: Goal: Ability to remain free from injury will improve Outcome: Progressing   Problem: Skin Integrity: Goal: Risk for impaired skin integrity will decrease Outcome: Progressing

## 2023-07-18 NOTE — Progress Notes (Signed)
Lactulose and glycerin suppositories ordered for constipation

## 2023-07-18 NOTE — Progress Notes (Signed)
PROGRESS NOTE  Caleb Mendoza  WUJ:[id number] DOB: August 12, 2001 DOA: 07/14/2023 PCP: Irven Coe, MD   Brief Narrative: Patient is a 22 year old male with no significant past medical history who presented with headache, left ear drainage, fever.  On presentation he was tachycardic, had leukocytosis as per labs.  CT of the temporal bone showed otomastoiditis with diffuse opacification.  MRI confirmed otomastoiditis with epidural empyema, thrombophlebitis / thrombosis of dural sinus.  CTA of the neck showed septic thrombophlebitis extending to the upper left IJ.  ENT consulted.  Started on heparin drip.  Currently on broad spectrum for antibiotics.  ID, neurosurgery also consulted  Assessment & Plan:  Principal Problem:   Mastoiditis Active Problems:   Otitis   Thrombophlebitis   Dural sinus thrombosis   Mastoiditis, left  Otomastoiditis /otitis media with tympanic membrane perforation, septic thrombophlebitis of left IJ vein: On Unasyn.  Continue pain management, supportive care.  On Ciprodex GTTs, ENT recommending to continue for 2 weeks.  ENT following without plan for operative intervention as pus is  draining.  Management as per ENT.  Leukocytosis has resolved. Patient will ultimately require ENT follow-up for tympanic membrane perforation and formal audiogram. As per ENT recommendation, checked MRI of the brain with and without contrast.  Follow-up MRI showed persistent changes of complicated left otomastoiditis with associated epidural empyema and septic thrombophlebitis with thrombosis at the left sigmoid sinus, jugular bulb, and proximal internal jugular vein.  Neurosurgery consulted, Dr. Franky Macho saw the patient ID c consulted for guidance on antibiotics, recommended to continue Unasyn for now and on discharge take 3 weeks of Augmentin, ID follow-up arranged. His headache and neck pain is better today.  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  After discussing with Dr. Franky Macho, most  likely will put him on aspirin on discharge  Hypokalemia: Being monitored and supplemented as needed  Elevated LFTs: Stable.  Right upper quadrant ultrasound did not show any acute findings. Has mild thrombocytopenia  Constipation: Continue bowel regimen        DVT prophylaxis:iv heparin     Code Status: Full Code  Family Communication: Discussed with mother on phone on 7/26  Patient status:Inpatient  Patient is from :home  Anticipated discharge NF:AOZH  Estimated DC date:after ENT ,neurosurgery clearance   Consultants: ENT, ID,neurosurgery  Procedures:None  Antimicrobials:  Anti-infectives (From admission, onward)    Start     Dose/Rate Route Frequency Ordered Stop   07/14/23 1245  Ampicillin-Sulbactam (UNASYN) 3 g in sodium chloride 0.9 % 100 mL IVPB        3 g 200 mL/hr over 30 Minutes Intravenous Every 6 hours 07/14/23 1237     07/14/23 0630  Ampicillin-Sulbactam (UNASYN) 3 g in sodium chloride 0.9 % 100 mL IVPB        3 g 200 mL/hr over 30 Minutes Intravenous  Once 07/14/23 0865 07/14/23 7846       Subjective: Patient seen and examined at bedside today.  He feels better.  He has some nausea this morning but the headache and neck pain have been significantly better.  Still having sanguinous discharge from the left ear  Objective: Vitals:   07/17/23 1551 07/17/23 1940 07/18/23 0345 07/18/23 0821  BP: (!) 135/90 (!) 134/92 119/79 120/81  Pulse: 63 60 (!) 52 (!) 57  Resp:      Temp: 98.1 F (36.7 C) 97.8 F (36.6 C) 98.4 F (36.9 C) 98 F (36.7 C)  TempSrc:  Oral    SpO2: 99% 100% 99%  99%  Weight:      Height:       No intake or output data in the 24 hours ending 07/18/23 1137  Filed Weights   07/14/23 1210 07/16/23 0600  Weight: 86 kg 82.9 kg    Examination:   General exam: Overall comfortable, not in distress HEENT: PERRL, left ear external auditory canal packed with gauze with sanguinous soakage Respiratory system:  no wheezes or  crackles  Cardiovascular system: S1 & S2 heard, RRR.  Gastrointestinal system: Abdomen is nondistended, soft and nontender. Central nervous system: Alert and oriented Extremities: No edema, no clubbing ,no cyanosis Skin: No rashes, no ulcers,no icterus     Data Reviewed: I have personally reviewed following labs and imaging studies  CBC: Recent Labs  Lab 07/14/23 0254 07/15/23 0117 07/16/23 0033 07/17/23 0135 07/18/23 0225  WBC 14.3* 11.8* 9.5 6.9 6.7  HGB 15.3 13.0 13.4 13.0 13.6  HCT 43.7 38.6* 39.0 37.6* 38.9*  MCV 83.1 84.3 85.3 81.9 82.4  PLT 172 134* 137* 142* 160   Basic Metabolic Panel: Recent Labs  Lab 07/14/23 0254 07/15/23 0117 07/16/23 0033  NA 137 132* 135  K 3.5 3.3* 3.6  CL 99 98 99  CO2 24 24 28   GLUCOSE 115* 109* 96  BUN 12 13 10   CREATININE 0.83 0.88 0.86  CALCIUM 9.9 8.5* 8.6*     No results found for this or any previous visit (from the past 240 hour(s)).   Radiology Studies: MR BRAIN W WO CONTRAST  Result Date: 07/18/2023 CLINICAL DATA:  Follow-up examination for dural sinus thrombosis. EXAM: MRI HEAD WITHOUT AND WITH CONTRAST TECHNIQUE: Multiplanar, multiecho pulse sequences of the brain and surrounding structures were obtained without and with intravenous contrast. CONTRAST:  8mL GADAVIST GADOBUTROL 1 MMOL/ML IV SOLN COMPARISON:  Previous MRI from 07/14/2023. FINDINGS: Brain: Persistent changes of left otomastoiditis again seen. Persistent intracranial spread of infection with epidural empyema at the posterior left temporal bone/lateral margin of the left cerebellum. Collection lateral to the left cerebellum is relatively similar measuring 2.3 x 0.8 x 2.3 cm (series 18, image 13, previously 2.3 x 0.6 x 2.4 cm). Additional component located just superior to the left tentorium measures 2.4 x 0.8 x 1.4 cm (series 18, image 19, previously 2.4 x 0.9 x 1.5 cm). Mild flattening and mass effect on the adjacent brain, similar. Associated dural thickening  and enhancement about the left cerebellum and left cerebral convexity, similar. Asymmetric enhancement about the left IAC. Persistent associated septic thrombophlebitis at the adjacent left sigmoid sinus, jugular bulb, and proximal internal jugular vein (series 18, image 8). Overall, these changes are relatively similar as compared to previous MRI from 07/14/2023. No evidence for distant spread of infection elsewhere within the brain. No ventricular debris or hydrocephalus. No evidence for acute or interval infarction. No intracranial mass lesion. Vascular: Persistent dural sinus thrombosis as above. 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 Bone marrow signal intensity within normal limits. Sinuses/Orbits: Globes and orbital soft tissues within normal limits. Mild mucosal thickening about the ethmoidal air cells without significant sinus opacification. Trace right mastoid effusion. Other: None. IMPRESSION: 1. Persistent changes of complicated left otomastoiditis with associated epidural empyema and septic thrombophlebitis with thrombosis at the left sigmoid sinus, jugular bulb, and proximal internal jugular vein. Overall, these changes are relatively similar as compared to previous MRI from 07/14/2023. 2. No other new acute intracranial abnormality. Electronically Signed   By: Janell Quiet.D.  On: 07/18/2023 03:41    Scheduled Meds:  ciprofloxacin-dexamethasone  4 drop Left EAR BID   Glycerin (Adult)  1 suppository Rectal Q12H   lactulose  10 g Oral Daily   LORazepam  1 mg Intravenous Once   pantoprazole  40 mg Oral Daily   sodium chloride flush  3 mL Intravenous Q12H   Continuous Infusions:  ampicillin-sulbactam (UNASYN) IV 3 g (07/18/23 1610)   heparin 1,650 Units/hr (07/18/23 0950)     LOS: 3 days   Burnadette Pop, MD [facility] P7/26/2024, 11:37 AM

## 2023-07-18 NOTE — Progress Notes (Signed)
ANTICOAGULATION CONSULT NOTE - Follow Up Consult  Pharmacy Consult for Heparin Indication:  venous sinus thrombosis  No Known Allergies  Patient Measurements: Height: 5\' 11"  (180.3 cm) Weight: 82.9 kg (182 lb 12.2 oz) IBW/kg (Calculated) : 75.3 Heparin Dosing Weight: 86 kg  Vital Signs: Temp: 98.4 F (36.9 C) (07/26 0345) Temp Source: Oral (07/25 1940) BP: 119/79 (07/26 0345) Pulse Rate: 52 (07/26 0345)  Labs: Recent Labs    07/16/23 0033 07/17/23 0135 07/18/23 0225  HGB 13.4 13.0 13.6  HCT 39.0 37.6* 38.9*  PLT 137* 142* 160  HEPARINUNFRC 0.35 0.30 0.36  CREATININE 0.86  --   --     Estimated Creatinine Clearance: 143.5 mL/min (by C-G formula based on SCr of 0.86 mg/dL).  Assessment: Patient admitted 07/14/23 with CC of persistent drainage from left ear. Initial imaging concerning for mastoiditis. MRI indicating sinus thrombosis that goes into the internal jugular. Pharmacy consulted to dose heparin.  HgB 14.3 and PLTS 172 on admit.  Not on anticoagulation PTA.   Heparin level is therapeutic (0.36) on 1650 units/hr.  Hgb 13.6, plt 160, stable  Goal of Therapy:  Heparin level 0.3-0.7 units/ml Monitor platelets by anticoagulation protocol: Yes   Plan:  Continue heparin at 1650 units/hr  Daily heparin level and CBC Monitor for signs/symptoms of bleeding.  Rennis Petty, PharmD 07/18/2023 7:34 AM

## 2023-07-18 NOTE — Progress Notes (Signed)
    [facility]   Reason for visit: Follow up on otomastoiditis  Interval History: MRI repeated and largely unchanged.  Dr. Franky Macho seeing today.    Day 6 antibiotics Afebrile > 48 hours WBC wnl at 6.7.    Physical Exam: Constitutional:  Vitals:   07/18/23 0345 07/18/23 0821  BP: 119/79 120/81  Pulse: (!) 52 (!) 57  Resp:    Temp: 98.4 F (36.9 C) 98 F (36.7 C)  SpO2: 99% 99%   patient appears in NAD Respiratory: Normal respiratory effort  Review of Systems: Constitutional: negative for fevers and chills  Lab Results  Component Value Date   WBC 6.7 07/18/2023   HGB 13.6 07/18/2023   HCT 38.9 (L) 07/18/2023   MCV 82.4 07/18/2023   PLT 160 07/18/2023    Lab Results  Component Value Date   CREATININE 0.86 07/16/2023   BUN 10 07/16/2023   NA 135 07/16/2023   K 3.6 07/16/2023   CL 99 07/16/2023   CO2 28 07/16/2023    Lab Results  Component Value Date   ALT 95 (H) 07/16/2023   AST 80 (H) 07/16/2023   ALKPHOS 41 07/16/2023     Microbiology: No results found for this or any previous visit (from the past 240 hour(s)).  Impression/Plan:  1. Purulent otitis media with perforation - some improvement and feeling better.  On ampicillin/sulbactam with fever and WBC trending down c/w improvement.   Will continue with the same antibiotics Will consider oral Augmentin at discharge if doing well  2.  Epidural empyema - neurosurgery evaluation  today.  Will monitor  3.  Thrombophlebitis - associated with above and on treatment with antibiotics.  Plan for about 3 weeks of antibiotics with reassessment.    Dr. Renold Don will follow over the weekend

## 2023-07-19 ENCOUNTER — Other Ambulatory Visit (HOSPITAL_COMMUNITY): Payer: Self-pay

## 2023-07-19 DIAGNOSIS — H7092 Unspecified mastoiditis, left ear: Secondary | ICD-10-CM | POA: Diagnosis not present

## 2023-07-19 LAB — GLUCOSE, CAPILLARY
Glucose-Capillary: 90 mg/dL (ref 70–99)
Glucose-Capillary: 72 mg/dL (ref 70–99)
Glucose-Capillary: 119 mg/dL — ABNORMAL HIGH (ref 70–99)

## 2023-07-19 MED ORDER — AMOXICILLIN-POT CLAVULANATE 875-125 MG PO TABS: 1.0000 | ORAL_TABLET | Freq: Two times a day (BID) | ORAL | Status: DC

## 2023-07-19 MED ORDER — IBUPROFEN 200 MG PO TABS
600.0000 mg | ORAL_TABLET | Freq: Four times a day (QID) | ORAL | 0 refills | Status: AC | PRN
  Filled 2023-07-19: qty 100, 9d supply, fill #0

## 2023-07-19 MED ORDER — ASPIRIN 81 MG PO TBEC
81.0000 mg | DELAYED_RELEASE_TABLET | Freq: Every day | ORAL | 1 refills | Status: AC
  Filled 2023-07-19 – 2023-08-18 (×2): qty 30, 30d supply, fill #0

## 2023-07-19 MED ORDER — CIPROFLOXACIN-DEXAMETHASONE 0.3-0.1 % OT SUSP
4.0000 [drp] | Freq: Two times a day (BID) | OTIC | 0 refills | Status: AC
  Filled 2023-07-19: qty 7.5, 14d supply, fill #0

## 2023-07-19 MED ORDER — OXYCODONE HCL 5 MG PO TABS
5.0000 mg | ORAL_TABLET | Freq: Four times a day (QID) | ORAL | 0 refills | Status: AC | PRN
Start: 2023-07-19 — End: ?
  Filled 2023-07-19: qty 15, 4d supply, fill #0

## 2023-07-19 MED ORDER — LEVETIRACETAM 500 MG PO TABS
500.0000 mg | ORAL_TABLET | Freq: Two times a day (BID) | ORAL | 0 refills | Status: DC
  Filled 2023-07-19: qty 60, 30d supply, fill #0

## 2023-07-19 MED ORDER — AMOXICILLIN-POT CLAVULANATE 875-125 MG PO TABS
1.0000 | ORAL_TABLET | Freq: Two times a day (BID) | ORAL | 0 refills | Status: AC
  Filled 2023-07-19: qty 42, 21d supply, fill #0

## 2023-07-19 MED ORDER — PANTOPRAZOLE SODIUM 40 MG PO TBEC
40.0000 mg | DELAYED_RELEASE_TABLET | Freq: Every day | ORAL | 0 refills | Status: AC
  Filled 2023-07-19: qty 14, 14d supply, fill #0

## 2023-07-19 MED ORDER — LEVETIRACETAM 500 MG PO TABS: 500.0000 mg | ORAL_TABLET | Freq: Two times a day (BID) | ORAL | 0 refills | Status: DC

## 2023-07-19 MED ORDER — LEVETIRACETAM 500 MG PO TABS: 500.0000 mg | ORAL_TABLET | Freq: Two times a day (BID) | ORAL | 0 refills | Status: AC

## 2023-07-19 MED ORDER — ONDANSETRON HCL 4 MG PO TABS
4.0000 mg | ORAL_TABLET | Freq: Every day | ORAL | 0 refills | Status: AC | PRN
  Filled 2023-07-19: qty 30, 30d supply, fill #0

## 2023-07-19 NOTE — Consult Note (Signed)
Reason for Consult:epidural empyema Referring Physician: Treysen, Caleb Mendoza is an 22 y.o. male.  HPI: whom presented with an ear infection on the left. [place]. [place] had continued pain and was admitted for headache, loss of hearing left ear, drainage from left ear. Had been on amoxicillin but on admission he complained of headache nausea and vomiting. Imaging revealed an epidural empyema, mastoiditis. Physical exam revealed perforated tympanic membrane, purulent draingae. Neurologically intact.  Past Medical History:  Diagnosis Date   Left-sided Bell's palsy     Past Surgical History:  Procedure Laterality Date   WISDOM TOOTH EXTRACTION Bilateral     Family History  Problem Relation Age of Onset   Stroke Paternal Grandfather     Social History:  reports that he has never smoked. He does not have any smokeless tobacco history on file. He reports current drug use. Frequency: 1.00 time per week. Drug: Marijuana. He reports that he does not drink alcohol.  Allergies:  Allergies  Allergen Reactions   Percocet [Oxycodone-Acetaminophen] Itching and Anxiety    Pt first time receiving this med    Medications: I have reviewed the patient's current medications.  Results for orders placed or performed during the hospital encounter of 07/14/23 (from the past 48 hour(s))  Heparin level (unfractionated)     Status: None   Collection Time: 07/18/23  2:25 AM  Result Value Ref Range   Heparin Unfractionated 0.36 0.30 - 0.70 IU/mL    Comment: (NOTE) The clinical reportable range upper limit is being lowered to >1.10 to align with the FDA approved guidance for the current laboratory assay.  If heparin results are below expected values, and patient dosage has  been confirmed, suggest follow up testing of antithrombin III levels. Performed at [facility], [address] [place], [place] [address]   CBC     Status:  Abnormal   Collection Time: 07/18/23  2:25 AM  Result Value Ref Range   WBC 6.7 4.0 - 10.5 K/uL   RBC 4.72 4.22 - 5.81 MIL/uL   Hemoglobin 13.6 13.0 - 17.0 g/dL   HCT 41.3 (L) 24.4 - 01.0 %   MCV 82.4 80.0 - 100.0 fL   MCH 28.8 26.0 - 34.0 pg   MCHC 35.0 30.0 - 36.0 g/dL   RDW 27.2 53.6 - 64.4 %   Platelets 160 150 - 400 K/uL   nRBC 0.0 0.0 - 0.2 %    Comment: Performed at [facility], [address] [place], [place] [address]  Heparin level (unfractionated)     Status: None   Collection Time: 07/19/23  2:22 AM  Result Value Ref Range   Heparin Unfractionated 0.40 0.30 - 0.70 IU/mL    Comment: (NOTE) The clinical reportable range upper limit is being lowered to >1.10 to align with the FDA approved guidance for the current laboratory assay.  If heparin results are below expected values, and patient dosage has  been confirmed, suggest follow up testing of antithrombin III levels. Performed at [facility], [address] [place], [place] [address]   Glucose, capillary     Status: None   Collection Time: 07/19/23  5:12 AM  Result Value Ref Range   Glucose-Capillary 90 70 - 99 mg/dL    Comment: Glucose reference range applies only to samples taken after fasting for at least 8 hours.  Glucose, capillary     Status: None  Collection Time: 07/19/23  7:49 AM  Result Value Ref Range   Glucose-Capillary 72 70 - 99 mg/dL    Comment: Glucose reference range applies only to samples taken after fasting for at least 8 hours.    MR BRAIN W WO CONTRAST  Result Date: 07/18/2023 CLINICAL DATA:  Follow-up examination for dural sinus thrombosis. EXAM: MRI HEAD WITHOUT AND WITH CONTRAST TECHNIQUE: Multiplanar, multiecho pulse sequences of the brain and surrounding structures were obtained without and with intravenous contrast. CONTRAST:  8mL GADAVIST GADOBUTROL 1 MMOL/ML IV SOLN COMPARISON:  Previous MRI from 07/14/2023. FINDINGS: Brain: Persistent changes of left otomastoiditis  again seen. Persistent intracranial spread of infection with epidural empyema at the posterior left temporal bone/lateral margin of the left cerebellum. Collection lateral to the left cerebellum is relatively similar measuring 2.3 x 0.8 x 2.3 cm (series 18, image 13, previously 2.3 x 0.6 x 2.4 cm). Additional component located just superior to the left tentorium measures 2.4 x 0.8 x 1.4 cm (series 18, image 19, previously 2.4 x 0.9 x 1.5 cm). Mild flattening and mass effect on the adjacent brain, similar. Associated dural thickening and enhancement about the left cerebellum and left cerebral convexity, similar. Asymmetric enhancement about the left IAC. Persistent associated septic thrombophlebitis at the adjacent left sigmoid sinus, jugular bulb, and proximal internal jugular vein (series 18, image 8). Overall, these changes are relatively similar as compared to previous MRI from 07/14/2023. No evidence for distant spread of infection elsewhere within the brain. No ventricular debris or hydrocephalus. No evidence for acute or interval infarction. No intracranial mass lesion. Vascular: Persistent dural sinus thrombosis as above. 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 Bone marrow signal intensity within normal limits. Sinuses/Orbits: Globes and orbital soft tissues within normal limits. Mild mucosal thickening about the ethmoidal air cells without significant sinus opacification. Trace right mastoid effusion. Other: None. IMPRESSION: 1. Persistent changes of complicated left otomastoiditis with associated epidural empyema and septic thrombophlebitis with thrombosis at the left sigmoid sinus, jugular bulb, and proximal internal jugular vein. Overall, these changes are relatively similar as compared to previous MRI from 07/14/2023. 2. No other new acute intracranial abnormality. Electronically Signed   By: Rise Mu M.D.   On: 07/18/2023 03:41     Review of Systems  Constitutional:  Positive for fever.  HENT:  Positive for hearing loss.   Eyes: Negative.   Respiratory: Negative.    Cardiovascular: Negative.   Gastrointestinal:  Positive for nausea and vomiting.  Genitourinary: Negative.   Musculoskeletal: Negative.   Skin: Negative.   Allergic/Immunologic: Negative.   Neurological: Negative.   Hematological: Negative.   Psychiatric/Behavioral: Negative.     Blood pressure (!) 126/95, pulse (!) 57, temperature 98 F (36.7 C), resp. rate 18, height 5\' 11"  (1.803 m), weight 82.9 kg, SpO2 100%. Physical Exam Constitutional:      Appearance: He is well-developed and normal weight.  HENT:     Head: Normocephalic and atraumatic.     Mouth/Throat:     Mouth: Mucous membranes are moist.     Pharynx: Oropharynx is clear.  Eyes:     General: No visual field deficit. Cardiovascular:     Rate and Rhythm: Normal rate and regular rhythm.  Pulmonary:     Effort: Pulmonary effort is normal.  Abdominal:     Palpations: Abdomen is soft.  Musculoskeletal:        General: Normal range of motion.     Cervical back: Normal  range of motion.  Skin:    General: Skin is warm and dry.  Neurological:     Mental Status: He is alert and oriented to person, place, and time.     Cranial Nerves: No cranial nerve deficit.     Sensory: No sensory deficit.     Motor: No weakness.     Coordination: Coordination normal.     Gait: Gait normal.     Deep Tendon Reflexes: Reflexes normal.  Psychiatric:        Mood and Affect: Mood normal.        Speech: Speech normal.        Behavior: Behavior normal.     Assessment/Plan: May be discharged to home. Neurologically is doing well. Continue keppra for home. I will see in two weeks, repeat Mri brau May give aspirin Coletta Memos 07/19/2023, 11:11 AM

## 2023-07-19 NOTE — Plan of Care (Signed)

## 2023-07-19 NOTE — Discharge Summary (Signed)
Physician Discharge Summary  Caleb Mendoza WJX:[id number] DOB: [birth year] DOA: 07/14/2023  PCP: Irven Coe, MD  Admit date: 07/14/2023 Discharge date: 07/19/2023  Admitted From: Home Disposition:  Home  Discharge Condition:Stable CODE STATUS:FULL Diet recommendation:  Regular  Brief/Interim Summary: Patient is a 22 year old male with no significant past medical history who presented with headache, left ear drainage, fever.  On presentation he was tachycardic, had leukocytosis as per labs.  CT of the temporal bone showed otomastoiditis with diffuse opacification.  MRI confirmed otomastoiditis with epidural empyema, thrombophlebitis / thrombosis of dural sinus.  CTA of the neck showed septic thrombophlebitis extending to the upper left IJ.  ENT consulted.  Started on heparin drip. Started on broad spectrum for antibiotics.  ID, neurosurgery also consulted .  He has significantly improved clinically.  Neurosurgery, ENT cleared for discharge and they will follow him as an outpatient as well as ID.  Medically stable for discharge.  Following problems were addressed during the hospitalization:  Otomastoiditis /otitis media with tympanic membrane perforation, septic thrombophlebitis of left IJ vein: On Unasyn.  Continue pain management, supportive care.  On Ciprodex GTTs, ENT recommending to continue for 2 weeks.  ENT following without plan for operative intervention as pus is  draining. Leukocytosis has resolved. Patient will ultimately require ENT follow-up for tympanic membrane perforation and formal audiogram. As per ENT recommendation, checked MRI of the brain with and without contrast.  Follow-up MRI showed persistent changes of complicated left otomastoiditis with associated epidural empyema and septic thrombophlebitis with thrombosis at the left sigmoid sinus, jugular bulb, and proximal internal jugular vein.  Neurosurgery consulted, Dr. Franky Macho saw the patient who cleared for discharge and  will follow as an outpatient.  Started on Keppra, aspirin ID  consulted for guidance on antibiotics, recommended to take 3 weeks of Augmentin, ID follow-up arranged. His headache and neck pain is better today.   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  After discussing with Dr. Franky Macho, we will put him on aspirin on discharge   Hypokalemia: Supplemented and corrected   Elevated LFTs: Stable.  Right upper quadrant ultrasound did not show any acute findings. Has mild thrombocytopenia     Discharge Diagnoses:  Principal Problem:   Mastoiditis Active Problems:   Otitis   Thrombophlebitis   Dural sinus thrombosis   Mastoiditis, left    Discharge Instructions  Discharge Instructions     Diet - low sodium heart healthy   Complete by: As directed    Discharge instructions   Complete by: As directed    1)Please take prescribed medications as instructed 2)Follow up with ENT and neurosurgery as an outpatient.  Name and number the provider has been attached 3)Follow up with your PCP in a week   Increase activity slowly   Complete by: As directed       Allergies as of 07/19/2023       Reactions   Percocet [oxycodone-acetaminophen] Itching, Anxiety   Pt first time receiving this med        Medication List     TAKE these medications    acetaminophen 500 MG tablet Commonly known as: TYLENOL Take 1,000 mg by mouth every 6 (six) hours as needed for moderate pain, fever or headache.   amoxicillin-clavulanate 875-125 MG tablet Commonly known as: AUGMENTIN Take 1 tablet by mouth every 12 (twelve) hours for 21 days. What changed: when to take this   aspirin EC 81 MG tablet Take 1 tablet (81 mg total) by mouth daily. Swallow  whole.   ciprofloxacin-dexamethasone OTIC suspension Commonly known as: CIPRODEX Place 4 drops into the left ear 2 (two) times daily for 14 days. What changed: how to take this   ibuprofen 200 MG tablet Commonly known as: ADVIL Take 3 tablets (600  mg total) by mouth every 6 (six) hours as needed for headache or moderate pain.   levETIRAcetam 500 MG tablet Commonly known as: KEPPRA Take 1 tablet (500 mg total) by mouth 2 (two) times daily.   oxyCODONE 5 MG immediate release tablet Commonly known as: Oxy IR/ROXICODONE Take 1 tablet (5 mg total) by mouth every 6 (six) hours as needed for moderate pain.   pantoprazole 40 MG tablet Commonly known as: PROTONIX Take 1 tablet (40 mg total) by mouth daily. Start taking on: July 20, 2023        Follow-up Information     Coletta Memos, MD Follow up in 3 week(s).   Specialty: Neurosurgery Contact information: [address] [place] [address] [phone]         Irven Coe, MD. Schedule an appointment as soon as possible for a visit in 1 week(s).   Specialty: Family Medicine Contact information: [address] [place] [address] [phone]                Allergies  Allergen Reactions   Percocet [Oxycodone-Acetaminophen] Itching and Anxiety    Pt first time receiving this med    Consultations: ID, neurosurgery, ENT   Procedures/Studies: MR BRAIN W WO CONTRAST  Result Date: 07/18/2023 CLINICAL DATA:  Follow-up examination for dural sinus thrombosis. EXAM: MRI HEAD WITHOUT AND WITH CONTRAST TECHNIQUE: Multiplanar, multiecho pulse sequences of the brain and surrounding structures were obtained without and with intravenous contrast. CONTRAST:  8mL GADAVIST GADOBUTROL 1 MMOL/ML IV SOLN COMPARISON:  Previous MRI from 07/14/2023. FINDINGS: Brain: Persistent changes of left otomastoiditis again seen. Persistent intracranial spread of infection with epidural empyema at the posterior left temporal bone/lateral margin of the left cerebellum. Collection lateral to the left cerebellum is relatively similar measuring 2.3 x 0.8 x 2.3 cm (series 18, image 13, previously 2.3 x 0.6 x 2.4 cm). Additional component located just superior to  the left tentorium measures 2.4 x 0.8 x 1.4 cm (series 18, image 19, previously 2.4 x 0.9 x 1.5 cm). Mild flattening and mass effect on the adjacent brain, similar. Associated dural thickening and enhancement about the left cerebellum and left cerebral convexity, similar. Asymmetric enhancement about the left IAC. Persistent associated septic thrombophlebitis at the adjacent left sigmoid sinus, jugular bulb, and proximal internal jugular vein (series 18, image 8). Overall, these changes are relatively similar as compared to previous MRI from 07/14/2023. No evidence for distant spread of infection elsewhere within the brain. No ventricular debris or hydrocephalus. No evidence for acute or interval infarction. No intracranial mass lesion. Vascular: Persistent dural sinus thrombosis as above. 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 Bone marrow signal intensity within normal limits. Sinuses/Orbits: Globes and orbital soft tissues within normal limits. Mild mucosal thickening about the ethmoidal air cells without significant sinus opacification. Trace right mastoid effusion. Other: None. IMPRESSION: 1. Persistent changes of complicated left otomastoiditis with associated epidural empyema and septic thrombophlebitis with thrombosis at the left sigmoid sinus, jugular bulb, and proximal internal jugular vein. Overall, these changes are relatively similar as compared to previous MRI from 07/14/2023. 2. No other new acute intracranial abnormality. Electronically Signed   By: Sharlet Salina  Phill Myron M.D.   On: 07/18/2023 03:41   US Abdomen Limited RUQ (LIVER/GB)  Result Date: 07/15/2023 CLINICAL DATA:  Elevated LFTs EXAM: ULTRASOUND ABDOMEN LIMITED RIGHT UPPER QUADRANT COMPARISON:  None Available. FINDINGS: Gallbladder: No gallstones or wall thickening visualized. No sonographic Murphy sign noted by sonographer. Common bile duct: Diameter: 2.8 mm. Liver: No focal lesion  identified. Within normal limits in parenchymal echogenicity. Portal vein is patent on color Doppler imaging with normal direction of blood flow towards the liver. Other: None. IMPRESSION: No acute abnormality noted. Electronically Signed   By: Alcide Clever M.D.   On: 07/15/2023 23:18   CT Angio Neck W and/or Wo Contrast  Result Date: 07/14/2023 CLINICAL DATA:  Venous thrombosis.  Septic thrombophlebitis. EXAM: CT ANGIOGRAPHY NECK TECHNIQUE: Multidetector CT imaging of the neck was performed using the standard protocol during bolus administration of intravenous contrast. Multiplanar CT image reconstructions and MIPs were obtained to evaluate the vascular anatomy. Carotid stenosis measurements (when applicable) are obtained utilizing NASCET criteria, using the distal internal carotid diameter as the denominator. RADIATION DOSE REDUCTION: This exam was performed according to the departmental dose-optimization program which includes automated exposure control, adjustment of the mA and/or kV according to patient size and/or use of iterative reconstruction technique. CONTRAST:  75mL OMNIPAQUE IOHEXOL 350 MG/ML SOLN COMPARISON:  CT of the temporal bones and brain MRI from earlier the same day FINDINGS: Aortic arch: Normal Right carotid system: Widely patent and smoothly contoured. Left carotid system: Widely patent and smoothly contoured. Vertebral arteries: Widely patent and smoothly contoured. Skeleton: Otomastoiditis on the left, known. Other neck: The patient's left IJ thrombus visibly terminates just below the jugular foramen. No inflammation or venous thrombosis seen in the neck. Upper chest: Clear. Intracranial findings as described on recent brain MRI. IMPRESSION: Septic thrombophlebitis affects only a short segment of the left IJ. No inflammation or collection seen in the neck. Intracranial findings as described on preceding brain MRI. Electronically Signed   By: Tiburcio Pea M.D.   On: 07/14/2023 11:13    MR Brain W and Wo Contrast  Result Date: 07/14/2023 CLINICAL DATA:  Concern for otomastoiditis.  Abnormal head CT. EXAM: MRI HEAD WITHOUT AND WITH CONTRAST TECHNIQUE: Multiplanar, multiecho pulse sequences of the brain and surrounding structures were obtained without and with intravenous contrast. CONTRAST:  10mL GADAVIST GADOBUTROL 1 MMOL/ML IV SOLN COMPARISON:  Temporal bone CT from earlier today FINDINGS: Brain: Epidural empyema along the left mastoid which restricts diffusion and uplifts the thrombosed transverse sigmoid junction with clot continuing into the upper left internal jugular vein. Abscess along the posteromedial mastoid measures 2.3 x 0.6 x 2.4 cm. Contiguous component which tracks superiorly along the posterior aspect of the temporal bone. Mild mass effect on the left lateral cerebellum without complicating infarct. No hydrocephalus. No generalized subarachnoid spread of infection seen. 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 Skull and upper cervical spine: Negative Sinuses/Orbits: As above.  Clear sinuses and negative orbits. Other: These results were called by telephone at the time of interpretation on 07/14/2023 at 10:36 am to provider [facility] , who verbally acknowledged these results. IMPRESSION: Complicated otomastoiditis with epidural empyema uplifting the thrombosed transverse sigmoid dural sinus with continuation into the left upper IJ. Electronically Signed   By: Tiburcio Pea M.D.   On: 07/14/2023 10:42   CT Temporal Bones Wo Contrast  Result Date: 07/14/2023 CLINICAL DATA:  Motor itis media with complication suspected. Headache with nausea and vomiting. Fever and chills. EXAM:  CT TEMPORAL BONES WITHOUT CONTRAST TECHNIQUE: Axial and coronal plane CT imaging of the petrous temporal bones was performed with thin-collimation image reconstruction. No intravenous contrast was administered. Multiplanar CT image reconstructions were also generated.  RADIATION DOSE REDUCTION: This exam was performed according to the departmental dose-optimization program which includes automated exposure control, adjustment of the mA and/or kV according to patient size and/or use of iterative reconstruction technique. COMPARISON:  05/30/2022 FINDINGS: RIGHT TEMPORAL BONE External auditory canal: Normal. Middle ear cavity: Normally aerated. The scutum and ossicles are normal. The tegmen tympani is intact. Inner ear structures: The cochlea, vestibule and semicircular canals are normal. The vestibular aqueduct is not enlarged. Internal auditory and facial nerve canals:  Normal Mastoid air cells: Normal pneumatization and diffuse aeration LEFT TEMPORAL BONE External auditory canal: Diffuse opacification by soft tissue density. No erosion seen. Middle ear cavity: Complete opacification.  No erosion detected. Inner ear structures: Normal formation and density. Internal auditory and facial nerve canals:  Unremarkable Mastoid air cells: Confluent opacification.  No coalescence/erosion. Vascular: No hyperdense vessel. Limited intracranial: Vague low-density along the inner table at the left temporal bone, see 5: 10 markings. Visible orbits/paranasal sinuses: Negative Soft tissues: No evidence of acute inflammation below the left mastoid. IMPRESSION: Otomastoiditis on the left with diffuse opacification since 05/30/2022. Vague low-density along the inner table of the left temporal bone, recommend postcontrast CT assessment versus MRI. Electronically Signed   By: Tiburcio Pea M.D.   On: 07/14/2023 05:50      Subjective: Patient seen and examined at bedside today.  Hemodynamically stable.  Denies any significant headache or neck pain today.  Eager to go home.  Medically stable for discharge.  I called his mom twice to discuss about discharge planning, call not received  Discharge Exam: Vitals:   07/19/23 0459 07/19/23 0735  BP: 117/73 (!) 126/95  Pulse: 71 (!) 57  Resp: 18 18   Temp: 98 F (36.7 C) 98 F (36.7 C)  SpO2: 100% 100%   Vitals:   07/18/23 1701 07/18/23 2000 07/19/23 0459 07/19/23 0735  BP: 120/76 (!) 139/94 117/73 (!) 126/95  Pulse: 64 85 71 (!) 57  Resp:  18 18 18   Temp: 98.1 F (36.7 C) 98 F (36.7 C) 98 F (36.7 C) 98 F (36.7 C)  TempSrc:      SpO2: 100% 98% 100% 100%  Weight:      Height:        General: Pt is alert, awake, not in acute distress Cardiovascular: RRR, S1/S2 +, no rubs, no gallops Respiratory: CTA bilaterally, no wheezing, no rhonchi Abdominal: Soft, NT, ND, bowel sounds + Extremities: no edema, no cyanosis    The results of significant diagnostics from this hospitalization (including imaging, microbiology, ancillary and laboratory) are listed below for reference.     Microbiology: No results found for this or any previous visit (from the past 240 hour(s)).   Labs: BNP (last 3 results) No results for input(s): "BNP" in the last 8760 hours. Basic Metabolic Panel: Recent Labs  Lab 07/14/23 0254 07/15/23 0117 07/16/23 0033  NA 137 132* 135  K 3.5 3.3* 3.6  CL 99 98 99  CO2 24 24 28   GLUCOSE 115* 109* 96  BUN 12 13 10   CREATININE 0.83 0.88 0.86  CALCIUM 9.9 8.5* 8.6*   Liver Function Tests: Recent Labs  Lab 07/14/23 0254 07/15/23 0117 07/16/23 0033  AST 18 76* 80*  ALT 21 67* 95*  ALKPHOS 54 44 41  BILITOT  1.5* 1.8* 1.4*  PROT 7.5 5.8* 5.9*  ALBUMIN 4.8 3.5 3.5   No results for input(s): "LIPASE", "AMYLASE" in the last 168 hours. No results for input(s): "AMMONIA" in the last 168 hours. CBC: Recent Labs  Lab 07/14/23 0254 07/15/23 0117 07/16/23 0033 07/17/23 0135 07/18/23 0225  WBC 14.3* 11.8* 9.5 6.9 6.7  HGB 15.3 13.0 13.4 13.0 13.6  HCT 43.7 38.6* 39.0 37.6* 38.9*  MCV 83.1 84.3 85.3 81.9 82.4  PLT 172 134* 137* 142* 160   Cardiac Enzymes: No results for input(s): "CKTOTAL", "CKMB", "CKMBINDEX", "TROPONINI" in the last 168 hours. BNP: Invalid input(s): "POCBNP" CBG: Recent  Labs  Lab 07/19/23 0512 07/19/23 0749  GLUCAP 90 72   D-Dimer No results for input(s): "DDIMER" in the last 72 hours. Hgb A1c No results for input(s): "HGBA1C" in the last 72 hours. Lipid Profile No results for input(s): "CHOL", "HDL", "LDLCALC", "TRIG", "CHOLHDL", "LDLDIRECT" in the last 72 hours. Thyroid function studies No results for input(s): "TSH", "T4TOTAL", "T3FREE", "THYROIDAB" in the last 72 hours.  Invalid input(s): "FREET3" Anemia work up No results for input(s): "VITAMINB12", "FOLATE", "FERRITIN", "TIBC", "IRON", "RETICCTPCT" in the last 72 hours. Urinalysis    Component Value Date/Time   COLORURINE YELLOW 05/30/2022 2138   APPEARANCEUR CLEAR 05/30/2022 2138   LABSPEC 1.041 (H) 05/30/2022 2138   PHURINE 7.0 05/30/2022 2138   GLUCOSEU NEGATIVE 05/30/2022 2138   HGBUR TRACE (A) 05/30/2022 2138   BILIRUBINUR NEGATIVE 05/30/2022 2138   KETONESUR 15 (A) 05/30/2022 2138   PROTEINUR TRACE (A) 05/30/2022 2138   NITRITE NEGATIVE 05/30/2022 2138   LEUKOCYTESUR NEGATIVE 05/30/2022 2138   Sepsis Labs Recent Labs  Lab 07/15/23 0117 07/16/23 0033 07/17/23 0135 07/18/23 0225  WBC 11.8* 9.5 6.9 6.7   Microbiology No results found for this or any previous visit (from the past 240 hour(s)).  Please note: You were cared for by a hospitalist during your hospital stay. Once you are discharged, your primary care physician will handle any further medical issues. Please note that NO REFILLS for any discharge medications will be authorized once you are discharged, as it is imperative that you return to your primary care physician (or establish a relationship with a primary care physician if you do not have one) for your post hospital discharge needs so that they can reassess your need for medications and monitor your lab values.    Time coordinating discharge: 40 minutes  SIGNED:   Burnadette Pop, MD  [facility] 07/19/2023, 11:40 AM Pager [phone]  If 7PM-7AM,  please contact night-coverage [email or web address] Password [email or web address]

## 2023-07-19 NOTE — Progress Notes (Signed)
Discharge instructions (including medications) discussed with and copy provided to patient/caregiver. Patient verbalizes understanding. PIV removed by primary RN and patient dressed himself. Medications collected from [facility] before discharge. Patient discharged home with family.

## 2023-07-19 NOTE — Progress Notes (Signed)
ANTICOAGULATION CONSULT NOTE - Follow Up Consult  Pharmacy Consult for Heparin Indication:  venous sinus thrombosis  Allergies  Allergen Reactions   Percocet [Oxycodone-Acetaminophen] Itching and Anxiety    Pt first time receiving this med    Patient Measurements: Height: 5\' 11"  (180.3 cm) Weight: 82.9 kg (182 lb 12.2 oz) IBW/kg (Calculated) : 75.3 Heparin Dosing Weight: 86 kg  Vital Signs: Temp: 98 F (36.7 C) (07/27 0735) BP: 126/95 (07/27 0735) Pulse Rate: 57 (07/27 0735)  Labs: Recent Labs    07/17/23 0135 07/18/23 0225 07/19/23 0222  HGB 13.0 13.6  --   HCT 37.6* 38.9*  --   PLT 142* 160  --   HEPARINUNFRC 0.30 0.36 0.40    Estimated Creatinine Clearance: 143.5 mL/min (by C-G formula based on SCr of 0.86 mg/dL).  Assessment: Patient admitted 07/14/23 with CC of persistent drainage from left ear. Initial imaging concerning for mastoiditis. MRI indicating sinus thrombosis that goes into the internal jugular. Pharmacy consulted to dose heparin.  HgB 14.3 and PLTS 172 on admit.  Not on anticoagulation PTA.   Heparin level is therapeutic at 0.4 on 1650 units/hr.  Hgb 13.6, plt 160, stable. No s/sx of bleeding noted.  Goal of Therapy:  Heparin level 0.3-0.7 units/ml Monitor platelets by anticoagulation protocol: Yes   Plan:  Continue heparin at 1650 units/hr  Daily heparin level and CBC Monitor for signs/symptoms of bleeding. Follow-up anticoagulation plans at discharge  Lennie Muckle, PharmD PGY1 Pharmacy Resident 07/19/2023 8:21 AM

## 2023-07-29 ENCOUNTER — Other Ambulatory Visit (HOSPITAL_COMMUNITY): Payer: Self-pay | Admitting: Neurosurgery

## 2023-07-29 DIAGNOSIS — H7012 Chronic mastoiditis, left ear: Secondary | ICD-10-CM

## 2023-08-02 ENCOUNTER — Ambulatory Visit (HOSPITAL_COMMUNITY)
Admission: RE | Admit: 2023-08-02 | Discharge: 2023-08-02 | Disposition: A | Discharge status: Home / Self Care | Payer: Medicaid Other | Source: Ambulatory Visit | Attending: Neurosurgery | Admitting: Neurosurgery

## 2023-08-02 DIAGNOSIS — H7012 Chronic mastoiditis, left ear: Secondary | ICD-10-CM | POA: Diagnosis present

## 2023-08-02 MED ORDER — GADOBUTROL 1 MMOL/ML IV SOLN
8.0000 mL | Freq: Once | INTRAVENOUS | Status: AC | PRN
  Administered 2023-08-02: 8 mL via INTRAVENOUS

## 2023-08-07 ENCOUNTER — Encounter: Payer: Self-pay | Admitting: Internal Medicine

## 2023-08-07 ENCOUNTER — Ambulatory Visit (INDEPENDENT_AMBULATORY_CARE_PROVIDER_SITE_OTHER): Payer: Medicaid Other | Admitting: Internal Medicine

## 2023-08-07 ENCOUNTER — Other Ambulatory Visit: Payer: Self-pay

## 2023-08-07 VITALS — BP 129/79 | HR 79 | Temp 98.5°F | Resp 16 | Ht 72.0 in | Wt 185.6 lb

## 2023-08-07 DIAGNOSIS — H7092 Unspecified mastoiditis, left ear: Secondary | ICD-10-CM | POA: Diagnosis present

## 2023-08-07 NOTE — Progress Notes (Signed)
   Subjective:    Patient ID: Caleb Mendoza, male    DOB: 2001/03/31, 22 y.o.   MRN: [id number]  HPI Caleb Mendoza is here for hospital follow up for otomastoiditis with epidural empyema and thrombosis of dural sinus.   He was hospitalized last month for above and found to have a perforated tympanic membrane and septic thormbophlebitis of the left internal jugular vein.  He was on ampicillin/sulbactam and improved with decline of fever and leukocytosis.  MRI cw above and stable on repeat scan.  Followed by Dr. Franky Macho and repeat MRi done after discharge with report pending.  He remains on Augmentin.     Review of Systems  Constitutional:  Negative for chills, fatigue and fever.  Gastrointestinal:  Negative for diarrhea.  Skin:  Negative for rash.       Objective:   Physical Exam HENT:     Head:     Comments: No further induration of the left internal jugular No warmth No ear drainage Eyes:     General: No scleral icterus. Pulmonary:     Effort: Pulmonary effort is normal.  Neurological:     Mental Status: He is alert.           Assessment & Plan:

## 2023-08-07 NOTE — Assessment & Plan Note (Signed)
He is doing well with no clincial concerns.  At this point, clinically appears resolved.  No further antibiotics indicated after his current supply. Follow up as needed.   I have personally spent 21 minutes involved in face-to-face and non-face-to-face activities for this patient on the day of the visit. Professional time spent includes the following activities: Preparing to see the patient (review of tests), Obtaining and/or reviewing separately obtained history (admission/discharge record), Performing a medically appropriate examination and/or evaluation , Ordering medications/tests/procedures, referring and communicating with other health care professionals, Documenting clinical information in the EMR, Independently interpreting results (not separately reported), Communicating results to the patient/family/caregiver, Counseling and educating the patient/family/caregiver and Care coordination (not separately reported).

## 2023-08-18 ENCOUNTER — Other Ambulatory Visit (HOSPITAL_COMMUNITY): Payer: Self-pay

## 2023-10-01 ENCOUNTER — Other Ambulatory Visit (HOSPITAL_COMMUNITY): Payer: Self-pay | Admitting: Neurosurgery

## 2023-10-01 DIAGNOSIS — H7012 Chronic mastoiditis, left ear: Secondary | ICD-10-CM

## 2023-10-25 ENCOUNTER — Ambulatory Visit (HOSPITAL_COMMUNITY)
Admission: RE | Admit: 2023-10-25 | Discharge: 2023-10-25 | Disposition: A | Discharge status: Home / Self Care | Payer: Medicaid Other | Source: Ambulatory Visit | Attending: Neurosurgery | Admitting: Neurosurgery

## 2023-10-25 DIAGNOSIS — H7012 Chronic mastoiditis, left ear: Secondary | ICD-10-CM | POA: Insufficient documentation

## 2023-10-25 MED ORDER — GADOBUTROL 1 MMOL/ML IV SOLN
8.0000 mL | Freq: Once | INTRAVENOUS | Status: AC | PRN
  Administered 2023-10-25: 8 mL via INTRAVENOUS
